# Patient Record
Sex: Female | Born: 1975
Health system: Southern US, Community
[De-identification: ages and names within clinical notes are randomized; demographics above are authoritative.]

## PROBLEM LIST (undated history)

## (undated) DIAGNOSIS — L400 Psoriasis vulgaris: Secondary | ICD-10-CM

## (undated) HISTORY — DX: Psoriasis vulgaris: L40.0

## (undated) HISTORY — PX: LAPAROSCOPIC ABDOMINAL EXPLORATION: SHX6249

## (undated) HISTORY — PX: TUBAL LIGATION: SHX77

---

## 2001-04-28 ENCOUNTER — Other Ambulatory Visit: Admission: RE | Admit: 2001-04-28 | Discharge: 2001-04-28 | Payer: Self-pay | Admitting: *Deleted

## 2003-07-11 ENCOUNTER — Inpatient Hospital Stay (HOSPITAL_COMMUNITY): Admission: AD | Admit: 2003-07-11 | Discharge: 2003-07-13 | Payer: Self-pay | Admitting: Obstetrics and Gynecology

## 2004-01-29 ENCOUNTER — Other Ambulatory Visit: Admission: RE | Admit: 2004-01-29 | Discharge: 2004-01-29 | Payer: Self-pay | Admitting: Obstetrics and Gynecology

## 2004-05-13 ENCOUNTER — Ambulatory Visit (HOSPITAL_COMMUNITY): Admission: RE | Admit: 2004-05-13 | Discharge: 2004-05-13 | Payer: Self-pay | Admitting: Obstetrics and Gynecology

## 2005-04-04 ENCOUNTER — Emergency Department (HOSPITAL_COMMUNITY): Admission: EM | Admit: 2005-04-04 | Discharge: 2005-04-04 | Payer: Self-pay | Admitting: Emergency Medicine

## 2005-05-19 ENCOUNTER — Ambulatory Visit (HOSPITAL_COMMUNITY): Admission: RE | Admit: 2005-05-19 | Discharge: 2005-05-19 | Payer: Self-pay | Admitting: Obstetrics and Gynecology

## 2008-02-25 ENCOUNTER — Ambulatory Visit (HOSPITAL_COMMUNITY): Admission: RE | Admit: 2008-02-25 | Discharge: 2008-02-25 | Payer: Self-pay | Admitting: Family Medicine

## 2008-03-07 ENCOUNTER — Other Ambulatory Visit: Admission: RE | Admit: 2008-03-07 | Discharge: 2008-03-07 | Payer: Self-pay | Admitting: Obstetrics and Gynecology

## 2009-03-20 ENCOUNTER — Other Ambulatory Visit: Admission: RE | Admit: 2009-03-20 | Discharge: 2009-03-20 | Payer: Self-pay | Admitting: Obstetrics and Gynecology

## 2011-04-04 NOTE — H&P (Signed)
   NAME:  [name], [name] NO.:  [id number]   MEDICAL RECORD NO.:  [id number]                  PATIENT TYPE:   LOCATION:                                       FACILITY:   PHYSICIAN:  Tilda Burrow, M.D.              DATE OF BIRTH:  [birth year]   DATE OF ADMISSION:  07/11/2003  DATE OF DISCHARGE:                                HISTORY & PHYSICAL   REASON FOR ADMISSION:  Pregnancy at 39 weeks and 1 day for elective  induction of labor due to maternal fatigue and swelling and insomnia.   HISTORY OF PRESENT ILLNESS:  Breanna Mcguire is a gravida 2, para 1 with an EDC of  August 30 who is in today with complaints of discomfort, swelling and  inability to sleep and desires elective induction. After discussing the  risks and benefits of induction, the patient still desires to proceed with  induction.   PAST MEDICAL HISTORY:  Negative.   PAST SURGICAL HISTORY:  Negative.   ALLERGIES:  She is allergic to SULFA, it causes a rash.   MEDICATIONS:  She is on prenatal vitamins and Tylenol p.r.n.   SOCIAL HISTORY:  She is married.   PRENATAL COURSE:  Essentially uneventful.   LABORATORY DATA:  Blood type is A positive, GBS is negative, rubella is  immune, hepatitis B surface antigen negative, HIV is negative, serology is  nonreactive, Pap is class 1. GC and Chlamydia are both negative. AFP is  normal, GBS is negative, 28 week hemoglobin 10.8, 28 week hematocrit 32.9,  one hour glucose tolerance was 112.   PHYSICAL EXAMINATION:  VITAL SIGNS:  Weight is 210, that is 6 pounds up from  last visit due to swelling. Blood pressure 128/60.  PELVIC:  She has 2+ pitting edema, there is good fetal movement. Fetal heart  rate is 130, strong and regular. Fundal height is 38 cm in vertex  presentation. GBS is negative. Cervix is 1-2, thick, soft, and posterior,  presenting part is -2.   PLAN:  We are going to admit for Foley bulb induction of labor.     Zerita Boers,  Reita Cliche, M.D.    DL/MEDQ  D:  91/47/8295  T:  07/11/2003  Job:  [id number]

## 2011-04-04 NOTE — Op Note (Signed)
NAMEALEKSA, Breanna Mcguire NO.:  [id number]   MEDICAL RECORD NO.:  [id number]          PATIENT TYPE:  AMB   LOCATION:  DAY                           FACILITY:  [facility]   PHYSICIAN:  Tilda Burrow, M.D. DATE OF BIRTH:  1976-04-19   DATE OF PROCEDURE:  05/19/2005  DATE OF DISCHARGE:                                 OPERATIVE REPORT   PREOPERATIVE DIAGNOSIS:  Elective sterilization.   POSTOPERATIVE DIAGNOSIS:  Elective sterilization.  Endometriosis grade 1.   OPERATION PERFORMED:  Laparoscopic tubal sterilization, Fallope rings.   SURGEON:  Tilda Burrow, M.D.   ASSISTANT:  None.   ANESTHESIA:  General.   COMPLICATIONS:  None.   FINDINGS:  Normal tubes and ovaries bilaterally, grade 1 endometrial  implants in the cul-de-sac.   INDICATIONS FOR PROCEDURE:  35 year old female requesting elective permanent  sterilization.   DESCRIPTION OF PROCEDURE:  The patient was taken to the operating room,  prepped and draped for combined abdominal and vaginal procedure with Hulka  tenaculum attached to the cervix and legs supported in yellow fin leg  supports. Infraumbilical vertical 1 cm skin incisions made as well as  transverse suprapubic incision.  The Veress needle was introduced through  the umbilicus and water droplet test indicated intraperitoneal location.  We  initially started insufflating and the pressure was 11 mm, so needle was  extracted and repositioned. Water  droplet test repeated and this time it  was under 8 mm mmHg pressure.  The pneumoperitoneum was achieved to 1.5  liters at which time laparoscopic trocar was introduced.  We could visualize  that we were intra-abdominal though there was a small amount of  intraperitoneal gas and some preperitoneal insufflation had occurred which  was not considered clinically significant.  We then insufflated the abdomen  entirely, inspected the bowel, saw no evidence of trauma, and then placed a  suprapubic 8 mm  trocar under direct visualization.  Attention was first  directed to the left tube and ovary.  The bladder was quite full and we in  and out catheterized the bladder to remove the physical obstruction of the  full bladder.  400 mL were obtained. Adnexa were then easily visualized and  there were no adhesions on the tube, ovary or anterior bladder flap. The cul-  de-sac showed two small endometrial implants of dark powder burn type  endometriosis in the pelvic floor.  There were photos taken to document  this.  We then proceeded with identification of each fallopian tube, we  elevated the left tube, placed two rings on the tube on this side and then  infiltrated the knuckle of tube and the underlying broad ligament with  Marcaine solution using a percutaneous needle.  The opposite tube was  treated similarly and a single ring placed on its midportion.  Inspection of  the pelvis again confirmed the endometriosis but no retractions, adhesions  or endometriomas. The patient then had instilling  saline solution into the abdomen followed by deflation of the abdomen,  closure of each skin incision with subcuticular 4-0 Dexon.  With sponge and  needle counts  correct, the patient tolerated the procedure well and went to  recovery room in good condition.       JVF/MEDQ  D:  05/19/2005  T:  05/19/2005  Job:  [id number]

## 2011-04-04 NOTE — H&P (Signed)
Breanna Mcguire, Breanna Mcguire             ACCOUNT NO.:  [id number]   MEDICAL RECORD NO.:  [id number]          PATIENT TYPE:  AMB   LOCATION:  DAY                           FACILITY:  [facility]   PHYSICIAN:  Tilda Burrow, M.D. DATE OF BIRTH:  07-12-1976   DATE OF ADMISSION:  05/19/2004  DATE OF DISCHARGE:  LH                                HISTORY & PHYSICAL   ADMITTING DIAGNOSES:  1.  Desire for elective permanent sterilization.  2.  Abdominal wall laxity.  3.  Right lower quadrant pain with menses extending down the right leg.   HISTORY OF PRESENT ILLNESS:  This 35 year old female, gravida 2, para 2,  several years status post second vaginal delivery, is admitted after  requesting elective permanent sterilization.  Contraceptive counseling has  been performed with technical aspects of the procedure reviewed using  Krame's instructional booklet with visual guides reviewed with the patient,  as per our protocol.  The patient declines any further consideration of  other surgeries.  More recently, she has diagnostic laparoscopy for chronic  pelvic pain on the right side, predominantly running down the right leg with  menses, which she has noted since her second pregnancy and the IUD that was  placed shortly after it.  This continued after the IUD was removed, and  laparoscopy was negative.  She has no history of back pain at this time.  The technical aspects of the procedure have been reviewed.  A failure of 1%  is quoted in addressing failure.   MEDICAL HISTORY:  Benign.   SURGICAL HISTORY:  Negative.   GYNECOLOGICAL HISTORY:  Abnormal Pap, treated with cryosurgery in 2005.  Two  consecutive negative Paps since then.   OBSTETRICAL HISTORY:  Vaginal delivery x2 with a 60 pound weight loss since  peak pregnancy weight.   PHYSICAL EXAMINATION:  GENERAL:  Healthy, somber, Caucasian female in  moderate discomfort, with pelvic exams otherwise normal.  HEENT:  Normal.  CHEST:  Clear to  auscultation.  ABDOMEN:  Nontender.  Lax skin.  No hernias or masses.  PELVIC:  External genitalia normal.  Vaginal exam with normal secretions.  Cervix multiparous.  Uterus anteflexed.  Adnexa nontender.   PLAN:  Elective sterilization with fallopian ring on May 19, 2005.       JVF/MEDQ  D:  05/14/2005  T:  05/14/2005  Job:  [id number]   cc:   [facility]   [facility]

## 2011-04-04 NOTE — Op Note (Signed)
NAME:  Breanna Mcguire, Breanna Mcguire.                      ACCOUNT NO.:  [id number]   MEDICAL RECORD NO.:  [id number]                  PATIENT TYPE:  AMB   LOCATION:                                       FACILITY:   PHYSICIAN:  Tilda Burrow, M.D.              DATE OF BIRTH:   DATE OF PROCEDURE:  05/13/2004  DATE OF DISCHARGE:                                 OPERATIVE REPORT   PREOPERATIVE DIAGNOSIS:  Chronic pelvic pain, rule out endometriosis.   POSTOPERATIVE DIAGNOSIS:  Chronic pelvic pain.   PROCEDURE:  Diagnostic laparoscopy.   SURGEON:  Tilda Burrow, M.D.   ASSISTANTAlinda Money, RN   ANESTHESIA:  General.   COMPLICATIONS:  None.   FINDINGS:  Completely normal appearing anatomy with generalized pelvic  laxity.  Large cul-de-sac.  Absolutely no evidence of endometriosis.   DETAILS OF PROCEDURE:  The patient was taken to the operating room and  prepped and draped in the usual fashion for combined abdominal and vaginal  procedure with Hulka tenaculum attached to the cervix for uterine  manipulation; and Foley catheter in place.   An infraumbilical vertical, 1-cm skin incision was made as well as a  transverse suprapubic incision.  The abdomen was palpated.  The sacral  promontory palpable 3 cm below the umbilicus. We were very careful to  elevate the abdominal wall while orienting the Veress needle towards the  pelvis; and pneumoperitoneum was achieved after water-droplet test confirmed  intraperitoneal location.  Abdominal insufflation was under 8 mmHg pressure.  Laparoscopic was placed under direct visualization, and suprapubic 5-mm  trocar placed without difficulty.   Inspection of the pelvis revealed normal anatomy with photos taken of the  bladder flap, cul-de-sac, both adnexa and taken to the office for discussion  with the patient.  There was absolutely no suggestion of endometriosis or  scarring.  Inspection of the upper abdomen showed no adhesions other than a  small piece of well-healed, quiet, flexible adhesions in the right lower  quadrant from the omentum to the anterior sidewall perhaps representing an  old peritonitis.  The appendix could be visualized and was nontender and  mobile.   Deflation of the abdomen was followed by subcutaneous closure of the  umbilical incision and subcutaneous and subcuticular closure of both skin  incisions.  The patient tolerated the procedure well; went to recovery room  in good condition.  Afterwards we discussed the findings with the patient's  husband and she had photographs.  The patient  will continue her birth control pills.  At the present time she is undecided  about future childbearing.  It is likely that once she has had all her  children that, if discomfort persists 6 months postpartum after her final  child she may consider vaginal hysterectomy for sterilization rather than  lesser procedure.      ___________________________________________  Tilda Burrow, M.D.   JVF/MEDQ  D:  05/13/2004  T:  05/13/2004  Job:  [id number]   cc:   Tilda Burrow, M.D.  [address] [place]  [place] [address]  Fax: [phone]

## 2011-04-04 NOTE — Op Note (Signed)
   NAME:  [name], [name] NO.:  [id number]   MEDICAL RECORD NO.:  [id number]                   PATIENT TYPE:  INP   LOCATION:  LDR1                                 FACILITY:  [facility]   PHYSICIAN:  Lazaro Arms, M.D.                DATE OF BIRTH:  [birth year]   DATE OF PROCEDURE:  07/12/2003  DATE OF DISCHARGE:                                 OPERATIVE REPORT   EPIDURAL NOTE   Breanna Mcguire is a 35 year old gravida 3, para 1, 4 cm dilated asking for an  epidural.   She is placed in the sitting position.  The iliac crests are identified.  The midline spinous processes are identified.  The L2-L3 interspace is  marked.  The area is prepped and draped.  Lidocaine 1% is injected as a  local anesthetic. A 17-gauge Touhy needle was used and a loss of resistance  technique is employed.  With 1 pass the epidural space is located without  difficulty.  Ten cc of 0.125% bupivacaine is given.  The catheter is then  threaded 5 cm into the epidural space.  An additional 10 cc of 0.125%  bupivacaine in 5 cc of 1.5% lidocaine with 1: 200,000 epinephrine is given  to dose up the epidural.  She is then hooked up to continuous pump of  0.125% bupivacaine with 2 mcg/cc of Fentanyl at 12 cc an hour.   The patient is comfortable, blood pressure is stable.  Fetal heart rate  tracing is reassuring.                                               Lazaro Arms, M.D.    Breanna Mcguire  D:  07/12/2003  T:  07/12/2003  Job:  [id number]

## 2011-04-04 NOTE — H&P (Signed)
NAME:  Breanna Mcguire, Breanna Mcguire NO.:  [id number]   MEDICAL RECORD NO.:  [id number]                   PATIENT TYPE:  AMB   LOCATION:  DAY                                  FACILITY:  [facility]   PHYSICIAN:  Tilda Burrow, M.D.              DATE OF BIRTH:  03-09-1976   DATE OF ADMISSION:  05/13/2004  DATE OF DISCHARGE:                                HISTORY & PHYSICAL   ADMISSION DIAGNOSIS:  Chronic pelvic pain, rule out endometriosis.   HISTORY OF PRESENT ILLNESS:  This 35 year old Mcguire is seen in followup for  chronic pelvic pain.  She complains of a continuous pain experience lasting  throughout the cycle noted especially with intimacy and before menses.  She  was evaluated by Dr. Despina Mcguire over the past few weeks and was described as  having a grade 1-2 prolapse, normal ovaries and negative cervical motion  tenderness.  She has been tried empirically on antibiotic therapy and has  had no improvement in her symptoms.  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  Endocervical curettage  was negative.  Breanna Mcguire is reluctant to make any decisions regarding the  surgical considerations such as hysterectomy until she has more information  about the presence or absence of endometriosis.  She has requested that we  move promptly to laparoscopy.  Plans for day surgery include laparoscopic  evaluation of the pelvis with laser if necessary for endometriosis.  If  findings are negative, then we will perform uterosacral nerve ablation and  simply follow the patient.   PAST MEDICAL HISTORY:  Benign.   PAST SURGICAL HISTORY:  Negative.   PAST GYNECOLOGICAL HISTORY:  Abnormal Pap smear treated with cryosurgery  earlier this year.   PAST SURGICAL HISTORY:  Negative.   MEDICATIONS:  Ortho-Evra oral contraceptives at present.  Prior use of IUD  removed in  January 2005.   PAST OBSTETRICAL HISTORY:  Spontaneous vaginal delivery in August 2004.   PHYSICAL EXAMINATION:  VITAL SIGNS:  Height 5 feet 5 inches, weight 164.  Blood pressure 120/72.  GENERAL:  This is a healthy-appearing, Breanna Mcguire, somber in moderate  discomfort with exam only.  HEENT:  Within normal limits.  CARDIAC:  Unremarkable.  ABDOMEN:  Nontender without masses.  PELVIC:  External genitalia normal.  Normal vaginal exam.  Multiparous.  First-degree uterine descensus.  Mobile cervix.  Uterus anteflexed.  Adnexa  negative for masses.  Dull pelvic tenderness.   ASSESSMENT:  1. Rule out endometriosis, chronic pelvic pain.  2. History of cervical dysplasia.   PLAN:  Diagnostic laparoscopy.     ___________________________________________  Tilda Burrow, M.D.   JVF/MEDQ  D:  05/13/2004  T:  05/13/2004  Job:  [id number]

## 2011-04-04 NOTE — Op Note (Signed)
   NAME:  DEONNA, KRUMMEL NO.:  [id number]   MEDICAL RECORD NO.:  [id number]                   PATIENT TYPE:  INP   LOCATION:  LDR1                                 FACILITY:  [facility]   PHYSICIAN:  Lazaro Arms, M.D.                DATE OF BIRTH:  01-04-1976   DATE OF PROCEDURE:  07/12/2003  DATE OF DISCHARGE:                                 OPERATIVE REPORT   ONSET OF LABOR:  July 12, 2003 at 6 a.m.   LENGTH OF FIRST STAGE LABOR:  6 hours and 20 minutes.   LENGTH OF SECOND STAGE LABOR:  16 minutes.   LENGTH OF THIRD STAGE LABOR:  6 minutes.   DESCRIPTION OF PROCEDURE:  Dorotea had a normal spontaneous vaginal  delivery under epidural anesthesia of a viable female infant. Apgar were 9&9.  Upon delivery of head, shoulders rotated easily and infant spontaneously  delivered without difficulty. Nose and mouth were thoroughly suctioned,  infant dried, cord clamped and cut. Infant placed on the mother's abdomen in  active condition with a good strong cry moving all extremities and pinking  up well. Upon inspection, the perineum was noted to be intact. Placenta was  delivered spontaneously via Schultz's mechanism, a three-vessel cord was  noted upon inspection. Due to a suspicious looking ancillary lobe versus  scarring from an early bleed, placenta was sent to pathology for evaluation.  Third stage of labor was actively managed with 20 units of Pitocin and 1000  mL of lactated Ringer's. Estimated blood loss was 350 mL. Infant and mother  stabilized and epidural catheter was removed from the patient's back and  blue tip was intact. Both were stable and transferred out to the postpartum  unit in good condition.     Zerita Boers, N.M.                      Lazaro Arms, M.D.    DL/MEDQ  D:  16/08/9603  T:  07/12/2003  Job:  [id number]

## 2015-06-27 ENCOUNTER — Ambulatory Visit (INDEPENDENT_AMBULATORY_CARE_PROVIDER_SITE_OTHER): Payer: BLUE CROSS/BLUE SHIELD | Admitting: Family Medicine

## 2015-06-27 ENCOUNTER — Encounter: Payer: Self-pay | Admitting: Family Medicine

## 2015-06-27 VITALS — BP 118/68 | Temp 98.5°F | Ht 65.0 in | Wt 190.2 lb

## 2015-06-27 DIAGNOSIS — H00036 Abscess of eyelid left eye, unspecified eyelid: Secondary | ICD-10-CM

## 2015-06-27 MED ORDER — DOXYCYCLINE HYCLATE 100 MG PO CAPS: 100.0000 mg | ORAL_CAPSULE | Freq: Two times a day (BID) | ORAL | Status: DC

## 2015-06-27 NOTE — Progress Notes (Signed)
   Subjective:    Patient ID: Breanna Mcguire, female    DOB: 1976-04-02, 39 y.o.   MRN: [id number]  HPI Patient arrives with c/o left eye lid swelling. Denies fevers chills sweats she relates soreness in the upper eyelid she denies blurred vision denies sinus pain or discomfort PMH benign  Review of Systems     Objective:   Physical Exam She has an infected gland along the upper eyelid no abscess noted with it. There is some redness and swelling in the upper eyelid the rest of the eye appears normal       Assessment & Plan:  Localized infection could be staph infection recommend antibiotics warm compresses if not significantly better over the course of the next 5 days to let us now

## 2015-07-30 ENCOUNTER — Ambulatory Visit (INDEPENDENT_AMBULATORY_CARE_PROVIDER_SITE_OTHER): Payer: BLUE CROSS/BLUE SHIELD | Admitting: Obstetrics and Gynecology

## 2015-07-30 ENCOUNTER — Encounter: Payer: Self-pay | Admitting: Obstetrics and Gynecology

## 2015-07-30 ENCOUNTER — Other Ambulatory Visit (HOSPITAL_COMMUNITY)
Admission: RE | Admit: 2015-07-30 | Discharge: 2015-07-30 | Disposition: A | Discharge status: Home / Self Care | Payer: BLUE CROSS/BLUE SHIELD | Source: Ambulatory Visit | Attending: Obstetrics and Gynecology | Admitting: Obstetrics and Gynecology

## 2015-07-30 VITALS — BP 110/60 | Ht 65.0 in | Wt 188.0 lb

## 2015-07-30 DIAGNOSIS — Z1151 Encounter for screening for human papillomavirus (HPV): Secondary | ICD-10-CM | POA: Insufficient documentation

## 2015-07-30 DIAGNOSIS — Z01419 Encounter for gynecological examination (general) (routine) without abnormal findings: Secondary | ICD-10-CM | POA: Diagnosis not present

## 2015-07-30 NOTE — Progress Notes (Signed)
Patient ID: Breanna Mcguire, female   DOB: 06-15-1976, 39 y.o.   MRN: [id number]  Assessment:  Annual Gyn Exam   Plan:  1. pap smear done, next pap due in 3 years 2. return annually or prn 3    Annual mammogram advised Subjective:  Breanna Mcguire is a 39 y.o. female No obstetric history on file. who presents for annual exam. Patient's last menstrual period was 07/08/2015. The patient has no complaints today. Pt reports menses that last 2-3 days. Her last GYN exam was 6 years ago. Pt denies bladder or bowel concerns.  The following portions of the patient's history were reviewed and updated as appropriate: allergies, current medications, past family history, past medical history, past social history, past surgical history and problem list. History reviewed. No pertinent past medical history.  Past Surgical History  Procedure Laterality Date  . Laparoscopic abdominal exploration    . Tubal ligation      No current outpatient prescriptions on file.  Review of Systems Constitutional: negative Gastrointestinal: negative Genitourinary: negative  Objective:  BP 110/60 mmHg  Ht  (1.651 m)  Wt 188 lb (85.276 kg)  BMI 31.28 kg/m2  LMP 07/08/2015   BMI: Body mass index is 31.28 kg/(m^2).  General Appearance: Alert, appropriate appearance for age. No acute distress HEENT: Grossly normal Neck / Thyroid:  Cardiovascular: RRR; normal S1, S2, no murmur Lungs: CTA bilaterally Back: No CVAT Breast Exam: Normal to inspection, Normal breast tissue bilaterally and No masses or nodes.No dimpling, nipple retraction or discharge. Gastrointestinal: Soft, non-tender, no masses or organomegaly Pelvic Exam: Vulva and vagina appear normal. Bimanual exam reveals normal uterus and adnexa. External genitalia: normal general appearance Vaginal: normal mucosa without prolapse or lesions and normal without tenderness, induration or masses Cervix: normal appearance and well-supported Adnexa:  normal bimanual exam Uterus: normal single, nontender and tiny; anterior Rectovaginal: normal rectal, no masses and guaiac negative stool obtained Lymphatic Exam: Non-palpable nodes in neck, clavicular, axillary, or inguinal regions  Skin: no rash or abnormalities Neurologic: Normal gait and speech, no tremor  Psychiatric: Alert and oriented, appropriate affect.  Urinalysis:Not done Hemoccult: Negative  Christin Bach. MD Pgr [phone] 10:39 AM   This chart was scribed for Tilda Burrow, MD by Gwenyth Ober, Medical Scribe. This patient was seen in room 2 and the patient's care was started at 11:22 AM.   I personally performed the services described in this documentation, which was SCRIBED in my presence. The recorded information has been reviewed and considered accurate. It has been edited as necessary during review. Tilda Burrow, MD

## 2015-07-30 NOTE — Progress Notes (Signed)
Patient ID: Breanna Mcguire, female   DOB: [birth year], 39 y.o.   MRN: [id number] Pt here today for annual exam.

## 2015-07-31 LAB — CYTOLOGY - PAP

## 2015-12-10 ENCOUNTER — Ambulatory Visit (INDEPENDENT_AMBULATORY_CARE_PROVIDER_SITE_OTHER): Payer: BLUE CROSS/BLUE SHIELD | Admitting: Nurse Practitioner

## 2015-12-10 ENCOUNTER — Encounter: Payer: Self-pay | Admitting: Nurse Practitioner

## 2015-12-10 VITALS — BP 110/72 | HR 72 | Ht 65.0 in | Wt 191.2 lb

## 2015-12-10 DIAGNOSIS — K219 Gastro-esophageal reflux disease without esophagitis: Secondary | ICD-10-CM | POA: Diagnosis not present

## 2015-12-10 DIAGNOSIS — K582 Mixed irritable bowel syndrome: Secondary | ICD-10-CM | POA: Diagnosis not present

## 2015-12-10 MED ORDER — PANTOPRAZOLE SODIUM 40 MG PO TBEC: 40.0000 mg | DELAYED_RELEASE_TABLET | Freq: Every day | ORAL | Status: DC

## 2015-12-10 MED ORDER — HYOSCYAMINE SULFATE 0.125 MG SL SUBL: 0.1250 mg | SUBLINGUAL_TABLET | SUBLINGUAL | Status: DC | PRN

## 2015-12-10 NOTE — Progress Notes (Signed)
Subjective:  Presents for c/o alternating cycles of diarrhea and constipation over the past year. No fevers. No blood in stool. Normal color. Lower abdominal cramping only with BM then resolves. Unassociated with meals or particular foods. Has had several BMs in the past 12 hours. Taking Nexium OTC occasionally for reflux. Her mother has GERD and IBS. Smoker. Drinks a large amount of caffeine.   Objective:   BP 110/72 mmHg  Pulse 72  Ht  (1.651 m)  Wt 191 lb 3.2 oz (86.728 kg)  BMI 31.82 kg/m2 NAD. Alert, oriented. Lungs clear. Heart RRR. Abdomen soft, mildly obese, non distended, with active BS x 4. Mild epigastric and generalized lower abdominal tenderness, more towards LLQ. No obvious masses; no rebound or guarding.   Assessment:  Problem List Items Addressed This Visit      Digestive   Gastroesophageal reflux disease without esophagitis - Primary   Relevant Medications   hyoscyamine (LEVSIN/SL) 0.125 MG SL tablet   pantoprazole (PROTONIX) 40 MG tablet   Irritable bowel syndrome with both constipation and diarrhea   Relevant Medications   hyoscyamine (LEVSIN/SL) 0.125 MG SL tablet   pantoprazole (PROTONIX) 40 MG tablet     Plan:  Meds ordered this encounter  Medications  . hyoscyamine (LEVSIN/SL) 0.125 MG SL tablet    Sig: Place 1 tablet (0.125 mg total) under the tongue every 4 (four) hours as needed.    Dispense:  30 tablet    Refill:  0    Order Specific Question:  Supervising Provider    Answer:  Merlyn Albert [[id number]]  . pantoprazole (PROTONIX) 40 MG tablet    Sig: Take 1 tablet (40 mg total) by mouth daily.    Dispense:  30 tablet    Refill:  2    Order Specific Question:  Supervising Provider    Answer:  Merlyn Albert [[id number]]   Switch to Pantoprazole to see if this will save money. Reduce tobacco and caffeine use. Given information on IBS and GERD. Add probiotics and fiber to diet. Call back if worsens or persists.

## 2015-12-10 NOTE — Patient Instructions (Addendum)
Align as directed OR activia yogurt benefiber  Gastroesophageal Reflux Disease, Adult Normally, food travels down the esophagus and stays in the stomach to be digested. However, when a person has gastroesophageal reflux disease (GERD), food and stomach acid move back up into the esophagus. When this happens, the esophagus becomes sore and inflamed. Over time, GERD can create small holes (ulcers) in the lining of the esophagus.  CAUSES This condition is caused by a problem with the muscle between the esophagus and the stomach (lower esophageal sphincter, or LES). Normally, the LES muscle closes after food passes through the esophagus to the stomach. When the LES is weakened or abnormal, it does not close properly, and that allows food and stomach acid to go back up into the esophagus. The LES can be weakened by certain dietary substances, medicines, and medical conditions, including:  Tobacco use.  Pregnancy.  Having a hiatal hernia.  Heavy alcohol use.  Certain foods and beverages, such as coffee, chocolate, onions, and peppermint. RISK FACTORS This condition is more likely to develop in:  People who have an increased body weight.  People who have connective tissue disorders.  People who use NSAID medicines. SYMPTOMS Symptoms of this condition include:  Heartburn.  Difficult or painful swallowing.  The feeling of having a lump in the throat.  Abitter taste in the mouth.  Bad breath.  Having a large amount of saliva.  Having an upset or bloated stomach.  Belching.  Chest pain.  Shortness of breath or wheezing.  Ongoing (chronic) cough or a night-time cough.  Wearing away of tooth enamel.  Weight loss. Different conditions can cause chest pain. Make sure to see your health care provider if you experience chest pain. DIAGNOSIS Your health care provider will take a medical history and perform a physical exam. To determine if you have mild or severe GERD, your  health care provider may also monitor how you respond to treatment. You may also have other tests, including:  An endoscopy toexamine your stomach and esophagus with a small camera.  A test thatmeasures the acidity level in your esophagus.  A test thatmeasures how much pressure is on your esophagus.  A barium swallow or modified barium swallow to show the shape, size, and functioning of your esophagus. TREATMENT The goal of treatment is to help relieve your symptoms and to prevent complications. Treatment for this condition may vary depending on how severe your symptoms are. Your health care provider may recommend:  Changes to your diet.  Medicine.  Surgery. HOME CARE INSTRUCTIONS Diet  Follow a diet as recommended by your health care provider. This may involve avoiding foods and drinks such as:  Coffee and tea (with or without caffeine).  Drinks that containalcohol.  Energy drinks and sports drinks.  Carbonated drinks or sodas.  Chocolate and cocoa.  Peppermint and mint flavorings.  Garlic and onions.  Horseradish.  Spicy and acidic foods, including peppers, chili powder, curry powder, vinegar, hot sauces, and barbecue sauce.  Citrus fruit juices and citrus fruits, such as oranges, lemons, and limes.  Tomato-based foods, such as red sauce, chili, salsa, and pizza with red sauce.  Fried and fatty foods, such as donuts, french fries, potato chips, and high-fat dressings.  High-fat meats, such as hot dogs and fatty cuts of red and white meats, such as rib eye steak, sausage, ham, and bacon.  High-fat dairy items, such as whole milk, butter, and cream cheese.  Eat small, frequent meals instead of large meals.  Avoid drinking large amounts of liquid with your meals.  Avoid eating meals during the 2-3 hours before bedtime.  Avoid lying down right after you eat.  Do not exercise right after you eat. General Instructions  Pay attention to any changes in  your symptoms.  Take over-the-counter and prescription medicines only as told by your health care provider. Do not take aspirin, ibuprofen, or other NSAIDs unless your health care provider told you to do so.  Do not use any tobacco products, including cigarettes, chewing tobacco, and e-cigarettes. If you need help quitting, ask your health care provider.  Wear loose-fitting clothing. Do not wear anything tight around your waist that causes pressure on your abdomen.  Raise (elevate) the head of your bed 6 inches (15cm).  Try to reduce your stress, such as with yoga or meditation. If you need help reducing stress, ask your health care provider.  If you are overweight, reduce your weight to an amount that is healthy for you. Ask your health care provider for guidance about a safe weight loss goal.  Keep all follow-up visits as told by your health care provider. This is important. SEEK MEDICAL CARE IF:  You have new symptoms.  You have unexplained weight loss.  You have difficulty swallowing, or it hurts to swallow.  You have wheezing or a persistent cough.  Your symptoms do not improve with treatment.  You have a hoarse voice. SEEK IMMEDIATE MEDICAL CARE IF:  You have pain in your arms, neck, jaw, teeth, or back.  You feel sweaty, dizzy, or light-headed.  You have chest pain or shortness of breath.  You vomit and your vomit looks like blood or coffee grounds.  You faint.  Your stool is bloody or black.  You cannot swallow, drink, or eat.   This information is not intended to replace advice given to you by your health care provider. Make sure you discuss any questions you have with your health care provider.   Document Released: 08/13/2005 Document Revised: 07/25/2015 Document Reviewed: 02/28/2015 Elsevier Interactive Patient Education 2016 [employer]     Irritable Bowel Syndrome, Adult Irritable bowel syndrome (IBS) is not one specific disease. It is a group of  symptoms that affects the organs responsible for digestion (gastrointestinal or GI tract).  To regulate how your GI tract works, your body sends signals back and forth between your intestines and your brain. If you have IBS, there may be a problem with these signals. As a result, your GI tract does not function normally. Your intestines may become more sensitive and overreact to certain things. This is especially true when you eat certain foods or when you are under stress.  There are four types of IBS. These may be determined based on the consistency of your stool:   IBS with diarrhea.   IBS with constipation.   Mixed IBS.   Unsubtyped IBS.  It is important to know which type of IBS you have. Some treatments are more likely to be helpful for certain types of IBS.  CAUSES  The exact cause of IBS is not known. RISK FACTORS You may have a higher risk of IBS if:  You are a woman.  You are younger than 41 years old.  You have a family history of IBS.  You have mental health problems.  You have had bacterial infection of your GI tract. SIGNS AND SYMPTOMS  Symptoms of IBS vary from person to person. The main symptom is abdominal pain or discomfort.  Additional symptoms usually include one or more of the following:   Diarrhea, constipation, or both.   Abdominal swelling or bloating.   Feeling full or sick after eating a small or regular-size meal.   Frequent gas.   Mucus in the stool.   A feeling of having more stool left after a bowel movement.  Symptoms tend to come and go. They may be associated with stress, psychiatric conditions, or nothing at all.  DIAGNOSIS  There is no specific test to diagnose IBS. Your health care provider will make a diagnosis based on a physical exam, medical history, and your symptoms. You may have other tests to rule out other conditions that may be causing your symptoms. These may include:   Blood tests.   X-rays.   CT  scan.  Endoscopy and colonoscopy. This is a test in which your GI tract is viewed with a long, thin, flexible tube. TREATMENT There is no cure for IBS, but treatment can help relieve symptoms. IBS treatment often includes:   Changes to your diet, such as:  Eating more fiber.  Avoiding foods that cause symptoms.  Drinking more water.  Eating regular, medium-sized portioned meals.  Medicines. These may include:  Fiber supplements if you have constipation.  Medicine to control diarrhea (antidiarrheal medicines).  Medicine to help control muscle spasms in your GI tract (antispasmodic medicines).  Medicines to help with any mental health issues, such as antidepressants or tranquilizers.  Therapy.  Talk therapy may help with anxiety, depression, or other mental health issues that can make IBS symptoms worse.  Stress reduction.  Managing your stress can help keep symptoms under control. HOME CARE INSTRUCTIONS   Take medicines only as directed by your health care provider.  Eat a healthy diet.  Avoid foods and drinks with added sugar.  Include more whole grains, fruits, and vegetables gradually into your diet. This may be especially helpful if you have IBS with constipation.  Avoid any foods and drinks that make your symptoms worse. These may include dairy products and caffeinated or carbonated drinks.  Do not eat large meals.  Drink enough fluid to keep your urine clear or pale yellow.  Exercise regularly. Ask your health care provider for recommendations of good activities for you.  Keep all follow-up visits as directed by your health care provider. This is important. SEEK MEDICAL CARE IF:   You have constant pain.  You have trouble or pain with swallowing.  You have worsening diarrhea. SEEK IMMEDIATE MEDICAL CARE IF:   You have severe and worsening abdominal pain.   You have diarrhea and:   You have a rash, stiff neck, or severe headache.   You are  irritable, sleepy, or difficult to awaken.   You are weak, dizzy, or extremely thirsty.   You have bright red blood in your stool or you have black tarry stools.   You have unusual abdominal swelling that is painful.   You vomit continuously.   You vomit blood (hematemesis).   You have both abdominal pain and a fever.    This information is not intended to replace advice given to you by your health care provider. Make sure you discuss any questions you have with your health care provider.   Document Released: 11/03/2005 Document Revised: 11/24/2014 Document Reviewed: 07/21/2014 Elsevier Interactive Patient Education [employer]

## 2016-01-04 ENCOUNTER — Ambulatory Visit (INDEPENDENT_AMBULATORY_CARE_PROVIDER_SITE_OTHER): Payer: BLUE CROSS/BLUE SHIELD | Admitting: Family Medicine

## 2016-01-04 ENCOUNTER — Encounter: Payer: Self-pay | Admitting: Family Medicine

## 2016-01-04 VITALS — BP 124/70 | Temp 98.2°F | Ht 65.0 in | Wt 190.4 lb

## 2016-01-04 DIAGNOSIS — J329 Chronic sinusitis, unspecified: Secondary | ICD-10-CM | POA: Diagnosis not present

## 2016-01-04 MED ORDER — BENZONATATE 100 MG PO CAPS: 100.0000 mg | ORAL_CAPSULE | Freq: Four times a day (QID) | ORAL | Status: DC | PRN

## 2016-01-04 MED ORDER — AMOXICILLIN 500 MG PO CAPS: 500.0000 mg | ORAL_CAPSULE | Freq: Three times a day (TID) | ORAL | Status: DC

## 2016-01-04 NOTE — Progress Notes (Signed)
   Subjective:    Patient ID: Janene Harvey, female    DOB: 1976/10/18, 40 y.o.   MRN: [id number]  URI  This is a new problem. The current episode started in the past 7 days. The problem has been unchanged. There has been no fever. Associated symptoms include coughing, headaches and a sore throat. Associated symptoms comments: Runny nose. She has tried nothing for the symptoms. The treatment provided no relief.  frontal headache but also diffuse in nature.  Others in family with confirm flu.  Symptoms have been going on for several days.  achey back and shoulders and dim energ      Review of Systems  HENT: Positive for sore throat.   Respiratory: Positive for cough.   Neurological: Positive for headaches.       Objective:   Physical Exam Alert, mild malaise. Hydration good Vitals stable. frontal/ maxillary tenderness evident positive nasal congestion. pharynx normal neck supple  lungs clear/no crackles or wheezes. heart regular in rhythm        Assessment & Plan:  Impression rhinosinusitis likely post viral, discussed with patient. plan antibiotics prescribed. Questions answered. Symptomatic care discussed. warning signs discussed. WSL Post flu/

## 2016-03-06 ENCOUNTER — Other Ambulatory Visit: Payer: Self-pay | Admitting: Nurse Practitioner

## 2016-12-17 ENCOUNTER — Ambulatory Visit (INDEPENDENT_AMBULATORY_CARE_PROVIDER_SITE_OTHER): Payer: BLUE CROSS/BLUE SHIELD | Admitting: Nurse Practitioner

## 2016-12-17 ENCOUNTER — Encounter: Payer: Self-pay | Admitting: Nurse Practitioner

## 2016-12-17 VITALS — BP 120/80 | Ht 65.0 in | Wt 181.0 lb

## 2016-12-17 DIAGNOSIS — N946 Dysmenorrhea, unspecified: Secondary | ICD-10-CM | POA: Diagnosis not present

## 2016-12-17 DIAGNOSIS — N951 Menopausal and female climacteric states: Secondary | ICD-10-CM | POA: Diagnosis not present

## 2016-12-17 MED ORDER — NORETHINDRONE 0.35 MG PO TABS: 1.0000 | ORAL_TABLET | Freq: Every day | ORAL | 11 refills | Status: DC

## 2016-12-17 NOTE — Progress Notes (Signed)
Subjective:  41 yo female see today for reports of hot flashes and mood swings.  Reports symptoms began in her late 6230's and have been progressively worsening.  Pt. Has experienced a great deal of stress for the past several years, but over the last two months she has felt a significant decrease in her stress, improving the severity of her mood swings, but not the frequency.  Experiences no mood swings while on her period, but frequently occur during the remainder of the month.  Hot flashes occur during the night, interrupts her sleep, keeping her fatigued throughout the day.  Has attempted OTC estrovent in the past with mild relief.  Has discussed with GYN with reported testing hormone levels with no interventions per pt. mother experienced menopause in her mid thirties.  LMP was approx. 1 wk ago with dysmenorrhea. Irregular cycles, no heavy bleeding.  Denies any other abnormal bleeding or pain. Married, same sexual partner. Has had BTL.  Objective:   BP 120/80   Ht 5\' 5"  (1.651 m)   Wt 181 lb (82.1 kg)   BMI 30.12 kg/m  Pt. Alert and oriented, NAD. Lungs: CTA, Cardiac: RRR, Abd: Soft, non-tender  Assessment:  Perimenopause  Dysmenorrhea    Plan:  Meds ordered this encounter  Medications  . norethindrone (MICRONOR,CAMILA,ERRIN) 0.35 MG tablet    Sig: Take 1 tablet (0.35 mg total) by mouth daily.    Dispense:  1 Package    Refill:  11    Order Specific Question:   Supervising Provider    Answer:   Riccardo DubinLUKING, WILLIAM S [[id number]]   Symptom care and warning signs reviewed  Reviewed hormonal options Smoking cessation education  Return in about 3 months (around 03/16/2017) for recheck.call back sooner if any problems or concerns

## 2017-03-16 ENCOUNTER — Ambulatory Visit (INDEPENDENT_AMBULATORY_CARE_PROVIDER_SITE_OTHER): Payer: BLUE CROSS/BLUE SHIELD | Admitting: Nurse Practitioner

## 2017-03-16 ENCOUNTER — Encounter: Payer: Self-pay | Admitting: Nurse Practitioner

## 2017-03-16 VITALS — BP 110/74 | Ht 65.0 in | Wt 190.0 lb

## 2017-03-16 DIAGNOSIS — L301 Dyshidrosis [pompholyx]: Secondary | ICD-10-CM

## 2017-03-16 DIAGNOSIS — N951 Menopausal and female climacteric states: Secondary | ICD-10-CM | POA: Diagnosis not present

## 2017-03-16 MED ORDER — MOMETASONE FUROATE 0.1 % EX CREA: 1.0000 "application " | TOPICAL_CREAM | Freq: Every day | CUTANEOUS | 0 refills | Status: DC

## 2017-03-16 NOTE — Progress Notes (Signed)
Subjective:  Presents for recheck on her perimenopause symptoms. Regular cycles, lighter flow. Hot flashes much improved, only 2-3 per month, only at night. History of dyshidrotic eczema on her hands. Has a spot on left hand that has come up again for the second time this month. Pruritic.   Objective:   BP 110/74   Ht  (1.651 m)   Wt 190 lb (86.2 kg)   BMI 31.62 kg/m  NAD. Alert, oriented. Lungs clear. Heart RRR. Dry, pink area lateral left hand with slight flakiness.  Assessment:   Problem List Items Addressed This Visit      Musculoskeletal and Integument   Dyshidrotic eczema     Other   Perimenopause - Primary       Plan:   Meds ordered this encounter  Medications  . mometasone (ELOCON) 0.1 % cream    Sig: Apply 1 application topically daily. Prn eczema    Dispense:  45 g    Refill:  0    Order Specific Question:   Supervising Provider    Answer:   Merlyn Albert [[id number]]   Continue Micronor as directed. Call our office or GYN if any problems.  Call back in 2 weeks if area on left hand persists.

## 2017-12-16 ENCOUNTER — Encounter: Payer: Self-pay | Admitting: Family Medicine

## 2017-12-17 ENCOUNTER — Encounter: Payer: Self-pay | Admitting: Nurse Practitioner

## 2017-12-17 ENCOUNTER — Ambulatory Visit (INDEPENDENT_AMBULATORY_CARE_PROVIDER_SITE_OTHER): Payer: BLUE CROSS/BLUE SHIELD | Admitting: Nurse Practitioner

## 2017-12-17 VITALS — BP 118/76 | Temp 98.2°F | Ht 65.0 in | Wt 186.0 lb

## 2017-12-17 DIAGNOSIS — J019 Acute sinusitis, unspecified: Secondary | ICD-10-CM | POA: Diagnosis not present

## 2017-12-17 DIAGNOSIS — B9689 Other specified bacterial agents as the cause of diseases classified elsewhere: Secondary | ICD-10-CM

## 2017-12-17 MED ORDER — AMOXICILLIN-POT CLAVULANATE 875-125 MG PO TABS: 1.0000 | ORAL_TABLET | Freq: Two times a day (BID) | ORAL | 0 refills | Status: DC

## 2017-12-19 ENCOUNTER — Encounter: Payer: Self-pay | Admitting: Nurse Practitioner

## 2017-12-19 NOTE — Progress Notes (Signed)
Subjective: Presents for complaints of sinus pressure and pain that began yesterday.  Left-sided facial area headache especially along the maxillary area.  No fever sore throat or ear pain.  Occasional nonproductive cough.  Mild wheezing at nighttime, defers need for an inhaler.  Chronic smoker.   Objective:   BP 118/76   Temp 98.2 F (36.8 C) (Oral)   Ht 5\' 5"  (1.651 m)   Wt 186 lb (84.4 kg)   BMI 30.95 kg/m  NAD.  Alert, oriented.  TMs clear effusion, no erythema.  Pharynx injected with green PND noted.  Neck supple with mild soft anterior adenopathy.  Lungs clear.  Heart regular rate and rhythm.  Assessment:  Acute bacterial rhinosinusitis    Plan:   Meds ordered this encounter  Medications  . amoxicillin-clavulanate (AUGMENTIN) 875-125 MG tablet    Sig: Take 1 tablet by mouth 2 (two) times daily.    Dispense:  20 tablet    Refill:  0    Order Specific Question:   Supervising Provider    Answer:   Merlyn AlbertLUKING, WILLIAM S [[id number]]    OTC meds as directed for congestion.  Discussed importance of smoking cessation and options.  Call back if symptoms worsen or persist.

## 2018-03-15 ENCOUNTER — Ambulatory Visit: Payer: BLUE CROSS/BLUE SHIELD | Admitting: Family Medicine

## 2018-03-15 ENCOUNTER — Encounter: Payer: Self-pay | Admitting: Family Medicine

## 2018-03-15 VITALS — BP 122/74 | Temp 98.4°F | Ht 65.0 in | Wt 186.0 lb

## 2018-03-15 DIAGNOSIS — R300 Dysuria: Secondary | ICD-10-CM | POA: Diagnosis not present

## 2018-03-15 DIAGNOSIS — N3000 Acute cystitis without hematuria: Secondary | ICD-10-CM | POA: Diagnosis not present

## 2018-03-15 LAB — POCT URINALYSIS DIPSTICK
PH UA: 5 (ref 5.0–8.0)
Spec Grav, UA: 1.01 (ref 1.010–1.025)

## 2018-03-15 MED ORDER — CIPROFLOXACIN HCL 250 MG PO TABS: 250.0000 mg | ORAL_TABLET | Freq: Two times a day (BID) | ORAL | 0 refills | Status: DC

## 2018-03-15 NOTE — Progress Notes (Signed)
   Subjective:    Patient ID: Breanna Mcguire, female    DOB: [birth year], 42 y.o.   MRN: [id number]  Urinary Tract Infection   This is a new problem. Episode onset: 4 days. Associated symptoms comments: dysuria. Treatments tried: avo.   Results for orders placed or performed in visit on 03/15/18  POCT urinalysis dipstick  Result Value Ref Range   Color, UA     Clarity, UA     Glucose, UA     Bilirubin, UA     Ketones, UA     Spec Grav, UA 1.010 1.010 - 1.025   Blood, UA trace    pH, UA 5.0 5.0 - 8.0   Protein, UA     Urobilinogen, UA  0.2 or 1.0 E.U./dL   Nitrite, UA     Leukocytes, UA 4+ (A) Negative   Appearance     Odor     Frequent urin burning uncomfrotsble   No fevdr or chil  No achey in low back or low abd o  Azo took otc helped some       Review of Systems Testing no headache no chest pain no abdominal pain    Objective:   Physical Exam  Alert active good hydration lungs clear.  Heart regular rate and rhythm.  No CVA tenderness.  Urinalysis 8-10 white blood cells per high-power field      Assessment & Plan:  Impression urinary tract infection plan antibiotics prescribed symptom care discussed warning signs discussed Greater than 50% of this 15 minute face to face visit was spent in counseling and discussion and coordination of care regarding the above diagnosis/diagnosies

## 2018-07-15 ENCOUNTER — Ambulatory Visit: Payer: BLUE CROSS/BLUE SHIELD | Admitting: Family Medicine

## 2018-07-15 ENCOUNTER — Encounter: Payer: Self-pay | Admitting: Family Medicine

## 2018-07-15 VITALS — BP 112/70 | Temp 98.5°F | Ht 65.0 in | Wt 191.8 lb

## 2018-07-15 DIAGNOSIS — L301 Dyshidrosis [pompholyx]: Secondary | ICD-10-CM | POA: Diagnosis not present

## 2018-07-15 MED ORDER — BETAMETHASONE DIPROPIONATE 0.05 % EX OINT: TOPICAL_OINTMENT | Freq: Two times a day (BID) | CUTANEOUS | 3 refills | Status: DC

## 2018-07-15 NOTE — Progress Notes (Signed)
   Subjective:    Patient ID: Breanna Mcguire, female    DOB: 10/13/1976, 42 y.o.   MRN: [id number]  Rash  This is a recurrent problem. Episode onset: over one year. Location: hands and right foot. (Itching and pain) Treatments tried: elocon.    Hands and ft quite itchy and with rash  Patient has long-standing history of tendency towards eczema  Is been using Elocon cream.  In the past it helped but does not now.  Involved on hands and foot.  Does not excessively wash hands   Review of Systems  Skin: Positive for rash.       Objective:   Physical Exam  Alert vitals stable, NAD. Blood pressure good on repeat. HEENT normal. Lungs clear. Heart regular rate and rhythm. Hands bilateral impressive hypertrophic thickening plaque like rash also right lateral foot      Assessment & Plan:  Impression eczema fairly severe.  Plan switch to Diprolene ointment.  Rationale discussed local measures discussed.

## 2018-09-20 ENCOUNTER — Other Ambulatory Visit: Payer: BLUE CROSS/BLUE SHIELD | Admitting: Obstetrics and Gynecology

## 2018-09-23 ENCOUNTER — Other Ambulatory Visit: Payer: BLUE CROSS/BLUE SHIELD | Admitting: Obstetrics and Gynecology

## 2018-10-11 ENCOUNTER — Ambulatory Visit: Payer: BLUE CROSS/BLUE SHIELD | Admitting: Family Medicine

## 2018-10-11 ENCOUNTER — Encounter: Payer: Self-pay | Admitting: Family Medicine

## 2018-10-11 VITALS — BP 106/70 | Ht 65.0 in | Wt 190.0 lb

## 2018-10-11 DIAGNOSIS — L301 Dyshidrosis [pompholyx]: Secondary | ICD-10-CM

## 2018-10-11 MED ORDER — PREDNISONE 10 MG PO TABS: ORAL_TABLET | ORAL | 0 refills | Status: DC

## 2018-10-11 MED ORDER — DOXYCYCLINE HYCLATE 100 MG PO TABS: 100.0000 mg | ORAL_TABLET | Freq: Two times a day (BID) | ORAL | 0 refills | Status: DC

## 2018-10-11 NOTE — Progress Notes (Signed)
   Subjective:    Patient ID: Janene HarveyKatherine W Lenz, female    DOB: 04/25/1976, 42 y.o.   MRN: [id number]  HPI  Patient is here today to follow up on her left hand and right foot rash. This rash has been ongoing for the last two years. She states she thought this was her eczema.This has now become raw looking and she is putting Betamethasone on it with it not helping much.    Patient does give history of eczema in the past.  Also gives history of MRSA within the family  Rash on hand is now sig worse    Review of Systems No headache, no major weight loss or weight gain, no chest pain no back pain abdominal pain no change in bowel habits complete ROS otherwise negative     Objective:   Physical Exam  Alert vitals stable, NAD. Blood pressure good on repeat. HEENT normal. Lungs clear. Heart regular rate and rhythm. Substantial worsening of rash with purulent areas.  Some pustules.  Weeping.  Substantial cracks.  Both on left hand and right foot  Impression worsening eczema.  Discussed.  Will cover infectious component.  Also steroids.  Local measures discussed dermatology referral     Assessment & Plan:

## 2018-11-01 DIAGNOSIS — L400 Psoriasis vulgaris: Secondary | ICD-10-CM | POA: Diagnosis not present

## 2018-11-18 ENCOUNTER — Other Ambulatory Visit (HOSPITAL_COMMUNITY)
Admission: RE | Admit: 2018-11-18 | Discharge: 2018-11-18 | Disposition: A | Discharge status: Home / Self Care | Payer: BLUE CROSS/BLUE SHIELD | Source: Ambulatory Visit | Attending: Obstetrics and Gynecology | Admitting: Obstetrics and Gynecology

## 2018-11-18 ENCOUNTER — Encounter: Payer: Self-pay | Admitting: Obstetrics and Gynecology

## 2018-11-18 ENCOUNTER — Ambulatory Visit (INDEPENDENT_AMBULATORY_CARE_PROVIDER_SITE_OTHER): Payer: BLUE CROSS/BLUE SHIELD | Admitting: Obstetrics and Gynecology

## 2018-11-18 ENCOUNTER — Encounter (INDEPENDENT_AMBULATORY_CARE_PROVIDER_SITE_OTHER): Payer: Self-pay

## 2018-11-18 VITALS — BP 127/78 | HR 81 | Ht 65.0 in | Wt 191.6 lb

## 2018-11-18 DIAGNOSIS — Z01419 Encounter for gynecological examination (general) (routine) without abnormal findings: Secondary | ICD-10-CM | POA: Insufficient documentation

## 2018-11-18 MED ORDER — ESTRADIOL 0.1 MG/24HR TD PTTW: 1.0000 | MEDICATED_PATCH | TRANSDERMAL | 12 refills | Status: DC

## 2018-11-18 MED ORDER — PROGESTERONE MICRONIZED 200 MG PO CAPS
200.0000 mg | ORAL_CAPSULE | Freq: Every day | ORAL | 1 refills | Status: DC
Start: 2018-11-18 — End: 2020-03-21

## 2018-11-18 NOTE — Progress Notes (Signed)
Patient ID: Breanna Mcguire, female   DOB: February 02, 1976, 43 y.o.   MRN: [id number]   Assessment:  1. Annual Gyn Exam 2. Perimenopausal hot flashes and mood swings 3. Psoriasis of hand and foot 4. Vivelle Dot , prometrium 200 mg q d x 2 wk, now and  q 3 months. Plan:  1. pap smear done, next pap due 3 years 2. return annually or prn 3    Annual mammogram advised after age 20 Subjective:  Breanna Mcguire is a 43 y.o. female No obstetric history on file. who presents for annual exam. Patient's last menstrual period was 11/06/2018. The patient has complaints today of hot flashes and mood swings. Her periods are irregular -- sometimes has none, sometimes has two each month. She is interested in stopping her periods and was put on Micronor but it did not work. It also did not help with her mood swings. She sleeps well but occasionally wakes up because of her hot flashes.   She has psoriasis and was given ointment but has not filled the prescription because it was expensive. She has not had a mammogram yet. Denies hx of blood clots. She smokes half a pack of cigarettes each day.  The following portions of the patient's history were reviewed and updated as appropriate: allergies, current medications, past family history, past medical history, past social history, past surgical history and problem list. History reviewed. No pertinent past medical history.  Past Surgical History:  Procedure Laterality Date   LAPAROSCOPIC ABDOMINAL EXPLORATION     TUBAL LIGATION      Current Outpatient Medications:    Nutritional Supplements (ESTROVEN PO), Take by mouth., Disp: , Rfl:    betamethasone dipropionate (DIPROLENE) 0.05 % ointment, Apply topically 2 (two) times daily. (Patient not taking: Reported on 11/18/2018), Disp: 50 g, Rfl: 3   mometasone (ELOCON) 0.1 % cream, Apply 1 application topically daily. Prn eczema (Patient not taking: Reported on 10/11/2018), Disp: 45 g, Rfl: 0  Review of  Systems Constitutional: negative Gastrointestinal: negative Genitourinary: negative  Objective:  BP 127/78 (BP Location: Right Arm, Patient Position: Sitting, Cuff Size: Normal)    Pulse 81    Ht 5\' 5"  (1.651 m)    Wt 191 lb 9.6 oz (86.9 kg)    LMP 11/06/2018    BMI 31.88 kg/m    BMI: Body mass index is 31.88 kg/m.  General Appearance: Alert, appropriate appearance for age. No acute distress HEENT: Grossly normal Neck / Thyroid:  Cardiovascular: RRR; normal S1, S2, no murmur Lungs: CTA bilaterally Back: No CVAT Breast Exam: No dimpling, nipple retraction or discharge. No masses or nodes., Normal to inspection, Normal breast tissue bilaterally and No masses or nodes.No dimpling, nipple retraction or discharge. Gastrointestinal: Soft, non-tender, no masses or organomegaly Pelvic Exam:  Vagina: healthy secretions Cervix: normal Rectovaginal: guaiac negative stool obtained Lymphatic Exam: Non-palpable nodes in neck, clavicular, axillary, or inguinal regions Skin: no rash or abnormalities Neurologic: Normal gait and speech, no tremor  Psychiatric: Alert and oriented, appropriate affect. Extremitiy: large area of psoriasis left palm and foot. Urinalysis:Not done  By signing my name below, I, Pietro Cassis, attest that this documentation has been prepared under the direction and in the presence of Tilda Burrow, MD. Electronically Signed: Pietro Cassis, Medical Scribe. 11/18/18. 10:41 AM.  I personally performed the services described in this documentation, which was SCRIBED in my presence. The recorded information has been reviewed and considered accurate. It has been edited as necessary during review.  Tilda Burrow, MD

## 2018-11-22 LAB — CYTOLOGY - PAP
Adequacy: ABSENT
Diagnosis: NEGATIVE
HPV: NOT DETECTED

## 2018-11-24 ENCOUNTER — Ambulatory Visit (INDEPENDENT_AMBULATORY_CARE_PROVIDER_SITE_OTHER): Payer: BLUE CROSS/BLUE SHIELD | Admitting: Family Medicine

## 2018-11-24 ENCOUNTER — Encounter: Payer: Self-pay | Admitting: Family Medicine

## 2018-11-24 VITALS — BP 110/78 | Ht 64.5 in | Wt 192.0 lb

## 2018-11-24 DIAGNOSIS — Z0000 Encounter for general adult medical examination without abnormal findings: Secondary | ICD-10-CM | POA: Diagnosis not present

## 2018-11-24 NOTE — Progress Notes (Signed)
Subjective:    Patient ID: Breanna Mcguire, female    DOB: [birth year], 43 y.o.   MRN: [id number]  HPI The patient comes in today for a wellness visit.  A review of their health history was completed. A review of medications was also completed.  Had pap at gyn.   Any needed refills; none  Eating habits: not health conscious, working on eating better  Falls/  MVA accidents in past few months: none  Regular exercise: no regular exercise.   Specialist pt sees on regular basis: Dr. Margo Aye for psoriasis. Dr. Emelda Fear for women's health  Preventative health issues were discussed. Regular vision exams. Does not get dental exams d/t lack of insurance.   Has not had mammogram - states she will call and schedule it.   Additional concerns: none  Declines flu vaccine. Has not had tetanus in over 10 years. Declines Tdap.   Current smoker, has cut down, but no desire to quit currently.    Review of Systems  Constitutional: Negative for chills, fatigue, fever and unexpected weight change.  HENT: Negative for congestion, ear pain, sinus pressure, sinus pain and sore throat.   Eyes: Negative for discharge and visual disturbance.  Respiratory: Negative for cough, shortness of breath and wheezing.   Cardiovascular: Negative for chest pain and leg swelling.  Gastrointestinal: Negative for abdominal pain, blood in stool, constipation, diarrhea, nausea and vomiting.  Genitourinary: Negative for difficulty urinating and hematuria.  Skin: Negative for color change.  Neurological: Negative for dizziness, weakness, light-headedness and headaches.  Hematological: Negative for adenopathy.  Psychiatric/Behavioral: Negative for dysphoric mood and suicidal ideas.  All other systems reviewed and are negative.      Objective:   Physical Exam Vitals signs and nursing note reviewed.  Constitutional:      General: She is not in acute distress.    Appearance: She is well-developed.  HENT:     Head:  Normocephalic and atraumatic.     Right Ear: Tympanic membrane normal.     Left Ear: Tympanic membrane normal.     Nose: Nose normal.     Mouth/Throat:     Mouth: Mucous membranes are moist.     Dentition: Abnormal dentition.     Pharynx: Oropharynx is clear. Uvula midline.  Eyes:     General:        Right eye: No discharge.        Left eye: No discharge.     Extraocular Movements: Extraocular movements intact.     Conjunctiva/sclera: Conjunctivae normal.     Pupils: Pupils are equal, round, and reactive to light.  Neck:     Musculoskeletal: Neck supple.     Thyroid: No thyromegaly.  Cardiovascular:     Rate and Rhythm: Normal rate and regular rhythm.     Heart sounds: Normal heart sounds. No murmur.  Pulmonary:     Effort: Pulmonary effort is normal. No respiratory distress.     Breath sounds: Normal breath sounds. No wheezing.  Abdominal:     General: Bowel sounds are normal. There is no distension.     Palpations: Abdomen is soft. There is no mass.     Tenderness: There is no abdominal tenderness.  Genitourinary:    Comments: Breast and GU exams deferred, done at GYN office Musculoskeletal:        General: No deformity.  Lymphadenopathy:     Cervical: No cervical adenopathy.  Skin:    General: Skin is warm and dry.  Neurological:  Mental Status: She is alert and oriented to person, place, and time.     Coordination: Coordination normal.  Psychiatric:        Mood and Affect: Mood normal.           Assessment & Plan:  Routine general medical examination at a health care facility - Plan: Lipid panel, Hepatic function panel, Basic metabolic panel, CBC with Differential/Platelet  Adult wellness-complete.wellness physical was conducted today. Importance of diet and exercise were discussed in detail.  In addition to this a discussion regarding safety was also covered. We also reviewed over immunizations and gave recommendations regarding current immunization needed  for age.   -Declined flu and tdap In addition to this additional areas were also touched on including: Preventative health exams needed:  Colonoscopy N/A Mammogram advised - pt will schedule Pap smear done at GYN  Patient was advised yearly wellness exam. Strongly encouraged regular dental exams. Screening labs ordered, will notify of results.  Encouraged smoking cessation, patient not interested at this time.

## 2018-11-24 NOTE — Patient Instructions (Addendum)
Please call and schedule your screening mammogram.  Recommend you see a dentist regularly for dental health.  Preventive Care 40-64 Years, Female Preventive care refers to lifestyle choices and visits with your health care provider that can promote health and wellness. What does preventive care include?   A yearly physical exam. This is also called an annual well check.  Dental exams once or twice a year.  Routine eye exams. Ask your health care provider how often you should have your eyes checked.  Personal lifestyle choices, including: ? Daily care of your teeth and gums. ? Regular physical activity. ? Eating a healthy diet. ? Avoiding tobacco and drug use. ? Limiting alcohol use. ? Practicing safe sex. ? Taking low-dose aspirin daily starting at age 56. ? Taking vitamin and mineral supplements as recommended by your health care provider. What happens during an annual well check? The services and screenings done by your health care provider during your annual well check will depend on your age, overall health, lifestyle risk factors, and family history of disease. Counseling Your health care provider may ask you questions about your:  Alcohol use.  Tobacco use.  Drug use.  Emotional well-being.  Home and relationship well-being.  Sexual activity.  Eating habits.  Work and work Statistician.  Method of birth control.  Menstrual cycle.  Pregnancy history. Screening You may have the following tests or measurements:  Height, weight, and BMI.  Blood pressure.  Lipid and cholesterol levels. These may be checked every 5 years, or more frequently if you are over 61 years old.  Skin check.  Lung cancer screening. You may have this screening every year starting at age 33 if you have a 30-pack-year history of smoking and currently smoke or have quit within the past 15 years.  Colorectal cancer screening. All adults should have this screening starting at age 70 and  continuing until age 60. Your health care provider may recommend screening at age 52. You will have tests every 1-10 years, depending on your results and the type of screening test. People at increased risk should start screening at an earlier age. Screening tests may include: ? Guaiac-based fecal occult blood testing. ? Fecal immunochemical test (FIT). ? Stool DNA test. ? Virtual colonoscopy. ? Sigmoidoscopy. During this test, a flexible tube with a tiny camera (sigmoidoscope) is used to examine your rectum and lower colon. The sigmoidoscope is inserted through your anus into your rectum and lower colon. ? Colonoscopy. During this test, a long, thin, flexible tube with a tiny camera (colonoscope) is used to examine your entire colon and rectum.  Hepatitis C blood test.  Hepatitis B blood test.  Sexually transmitted disease (STD) testing.  Diabetes screening. This is done by checking your blood sugar (glucose) after you have not eaten for a while (fasting). You may have this done every 1-3 years.  Mammogram. This may be done every 1-2 years. Talk to your health care provider about when you should start having regular mammograms. This may depend on whether you have a family history of breast cancer.  BRCA-related cancer screening. This may be done if you have a family history of breast, ovarian, tubal, or peritoneal cancers.  Pelvic exam and Pap test. This may be done every 3 years starting at age 76. Starting at age 40, this may be done every 5 years if you have a Pap test in combination with an HPV test.  Bone density scan. This is done to screen for osteoporosis. You  may have this scan if you are at high risk for osteoporosis. Discuss your test results, treatment options, and if necessary, the need for more tests with your health care provider. Vaccines Your health care provider may recommend certain vaccines, such as:  Influenza vaccine. This is recommended every year.  Tetanus,  diphtheria, and acellular pertussis (Tdap, Td) vaccine. You may need a Td booster every 10 years.  Varicella vaccine. You may need this if you have not been vaccinated.  Zoster vaccine. You may need this after age 10.  Measles, mumps, and rubella (MMR) vaccine. You may need at least one dose of MMR if you were born in 1957 or later. You may also need a second dose.  Pneumococcal 13-valent conjugate (PCV13) vaccine. You may need this if you have certain conditions and were not previously vaccinated.  Pneumococcal polysaccharide (PPSV23) vaccine. You may need one or two doses if you smoke cigarettes or if you have certain conditions.  Meningococcal vaccine. You may need this if you have certain conditions.  Hepatitis A vaccine. You may need this if you have certain conditions or if you travel or work in places where you may be exposed to hepatitis A.  Hepatitis B vaccine. You may need this if you have certain conditions or if you travel or work in places where you may be exposed to hepatitis B.  Haemophilus influenzae type b (Hib) vaccine. You may need this if you have certain conditions. Talk to your health care provider about which screenings and vaccines you need and how often you need them. This information is not intended to replace advice given to you by your health care provider. Make sure you discuss any questions you have with your health care provider. Document Released: 11/30/2015 Document Revised: 12/24/2017 Document Reviewed: 09/04/2015 Elsevier Interactive Patient Education  2019 [place], Adult Preventive dental care includes seeing a dentist regularly and practicing good dental care (oral hygiene) at home. These actions can help to prevent cavities and other tooth problems, root canal problems, gum disease (gingivitis), and tooth loss. Regular dental exams may also help your health care provider to diagnose other medical problems. Many diseases,  including mouth cancers, have early signs that can be found during a preventive dental care visit. What can I expect during my dental visits? Many adults see their dentist one or two times each year for oral exams and cleanings. Talk with your dentist about the best preventive dental care schedule for you. At your visit, your dentist may ask you about: Your overall health and diet. Any new symptoms, such as: Bleeding gums. Mouth, tooth, or jaw pain. Your dentist will do a mouth (oral) exam to check for: Cavities. Gingivitis or other problems. Signs of cancer. Neck swelling or lumps. Abnormal jaw movement or pain in the jaw joint. You may also have: Dental X-rays. Your teeth cleaned. If you have an early problem, like a cavity, your dentist will schedule time for you to get treatment. If you have a tooth root problem, gum disease, or a sign of another disease, your dentist may send you to see another health care provider for care. How can I care for my teeth at home? Brush with an approved fluoride toothpaste every morning and night. If possible, brush within 10 minutes after every meal. Floss one time every day. Periodically check your teeth for white or brown spots after brushing. These may be signs of cavities. Check your gums for swelling or  bleeding. These may be signs of gum disease, such as gingivitis or periodontitis. Make sure your diet includes plenty of fruits, vegetables, milk and dairy products, whole grains, and proteins. Do not eat a lot of starchy foods or foods with added sugar. Talk with your health care provider if you have questions about following a healthy diet. Avoid sodas, sugary snacks, and sticky candies. Do not smoke. Do not get mouth piercings. If you have tooth or gum pain, gargle with a salt-water mixture 3-4 times per day or as needed. To make a salt-water mixture, completely dissolve -1 tsp of salt in 1 cup of warm water. Take over-the counter and  prescription medicines only as told by your dentist. If you have a permanent tooth knocked out: Find the tooth. Pick it up by the top (crown) with a tissue or gauze. Wash the tooth for no more than 10 seconds under cold, running water. Try to put the tooth back into the gum socket. Put the tooth in a glass of milk if you cannot get it back in place. Go to your dentist right away. Take the tooth with you. When should I seek medical care? Call your dentist if you have: Gum, tooth, or jaw pain. Red, swollen, or bleeding gums. A tooth or teeth that are very sensitive to hot or cold. Very bad breath. A problem with a filling, crown, implant, or denture. A broken or loose tooth. A growth or sore in your mouth that is not going away. Where to find more information American Dental Association: http://clayton-rivera.info/ This information is not intended to replace advice given to you by your health care provider. Make sure you discuss any questions you have with your health care provider. Document Released: 07/25/2015 Document Revised: 04/10/2016 Document Reviewed: 04/17/2015 Elsevier Interactive Patient Education  2019 [employer].

## 2018-12-13 DIAGNOSIS — L400 Psoriasis vulgaris: Secondary | ICD-10-CM | POA: Diagnosis not present

## 2019-01-06 DIAGNOSIS — L400 Psoriasis vulgaris: Secondary | ICD-10-CM | POA: Diagnosis not present

## 2019-01-17 DIAGNOSIS — Z0000 Encounter for general adult medical examination without abnormal findings: Secondary | ICD-10-CM | POA: Diagnosis not present

## 2019-01-18 LAB — BASIC METABOLIC PANEL
BUN/Creatinine Ratio: 8 — ABNORMAL LOW (ref 9–23)
BUN: 6 mg/dL (ref 6–24)
CO2: 22 mmol/L (ref 20–29)
CREATININE: 0.79 mg/dL (ref 0.57–1.00)
Calcium: 9.4 mg/dL (ref 8.7–10.2)
Chloride: 105 mmol/L (ref 96–106)
GFR calc Af Amer: 105 mL/min/{1.73_m2} (ref >59)
GFR, EST NON AFRICAN AMERICAN: 91 mL/min/{1.73_m2} (ref >59)
Glucose: 81 mg/dL (ref 65–99)
Potassium: 4.3 mmol/L (ref 3.5–5.2)
Sodium: 140 mmol/L (ref 134–144)

## 2019-01-18 LAB — CBC WITH DIFFERENTIAL/PLATELET
BASOS: 1 %
Basophils Absolute: 0.1 10*3/uL (ref 0.0–0.2)
EOS (ABSOLUTE): 0.1 10*3/uL (ref 0.0–0.4)
EOS: 1 %
HEMOGLOBIN: 14.2 g/dL (ref 11.1–15.9)
Hematocrit: 42.1 % (ref 34.0–46.6)
IMMATURE GRANS (ABS): 0.1 10*3/uL (ref 0.0–0.1)
IMMATURE GRANULOCYTES: 1 %
Lymphocytes Absolute: 2.4 10*3/uL (ref 0.7–3.1)
Lymphs: 25 %
MCH: 29.5 pg (ref 26.6–33.0)
MCHC: 33.7 g/dL (ref 31.5–35.7)
MCV: 87 fL (ref 79–97)
MONOCYTES: 4 %
MONOS ABS: 0.4 10*3/uL (ref 0.1–0.9)
NEUTROS PCT: 68 %
Neutrophils Absolute: 6.4 10*3/uL (ref 1.4–7.0)
Platelets: 258 10*3/uL (ref 150–450)
RBC: 4.82 x10E6/uL (ref 3.77–5.28)
RDW: 12.9 % (ref 11.7–15.4)
WBC: 9.4 10*3/uL (ref 3.4–10.8)

## 2019-01-18 LAB — HEPATIC FUNCTION PANEL
ALT: 11 IU/L (ref 0–32)
AST: 12 IU/L (ref 0–40)
Albumin: 4.2 g/dL (ref 3.8–4.8)
Alkaline Phosphatase: 103 IU/L (ref 39–117)
BILIRUBIN, DIRECT: 0.12 mg/dL (ref 0.00–0.40)
Bilirubin Total: 0.4 mg/dL (ref 0.0–1.2)
Total Protein: 6.7 g/dL (ref 6.0–8.5)

## 2019-01-18 LAB — LIPID PANEL
Chol/HDL Ratio: 4.1 ratio (ref 0.0–4.4)
Cholesterol, Total: 214 mg/dL — ABNORMAL HIGH (ref 100–199)
HDL: 52 mg/dL (ref >39)
LDL Calculated: 141 mg/dL — ABNORMAL HIGH (ref 0–99)
TRIGLYCERIDES: 106 mg/dL (ref 0–149)
VLDL Cholesterol Cal: 21 mg/dL (ref 5–40)

## 2019-02-14 DIAGNOSIS — Z79899 Other long term (current) drug therapy: Secondary | ICD-10-CM | POA: Diagnosis not present

## 2019-02-14 DIAGNOSIS — L400 Psoriasis vulgaris: Secondary | ICD-10-CM | POA: Diagnosis not present

## 2019-02-17 ENCOUNTER — Encounter: Payer: Self-pay | Admitting: Obstetrics and Gynecology

## 2019-02-17 ENCOUNTER — Ambulatory Visit (INDEPENDENT_AMBULATORY_CARE_PROVIDER_SITE_OTHER): Payer: BLUE CROSS/BLUE SHIELD | Admitting: Obstetrics and Gynecology

## 2019-02-17 ENCOUNTER — Other Ambulatory Visit: Payer: Self-pay

## 2019-02-17 DIAGNOSIS — L403 Pustulosis palmaris et plantaris: Secondary | ICD-10-CM | POA: Diagnosis not present

## 2019-02-17 DIAGNOSIS — N951 Menopausal and female climacteric states: Secondary | ICD-10-CM | POA: Diagnosis not present

## 2019-02-17 NOTE — Progress Notes (Signed)
Patient ID: Breanna Mcguire, female   DOB: Apr 22, 1976, 43 y.o.   MRN: [id number]   TELEHEALTH VIRTUAL GYNECOLOGY VISIT ENCOUNTER NOTE  I connected with Breanna Mcguire on 02/17/2019 at  1:30 PM EDT by telephone at home and verified that I am speaking with the correct person using two identifiers.   I discussed the limitations, risks, security and privacy concerns of performing an evaluation and management service by telephone and the availability of in person appointments. I also discussed with the patient that there may be a patient responsible charge related to this service. The patient expressed understanding and agreed to proceed.   History:  Breanna Mcguire is a 43 y.o. G2P0 female being evaluated today for medication management for vivelle-dot & pometrium.  Is doing well overall but the day before she has to switch the patches she has some night sweat. It wakes her up at night and disturbs her sleep cycle. Says it is a minor nuisance but otherwise bearable.  Hasn't stopped having periods, they have become more regular and only having one period every month. Has reduced her smoking due to not being able to leave home much due to covid19 stay at home order. She denies any other abnormal vaginal discharge, bleeding, pelvic pain or other concerns.       No past medical history on file. Past Surgical History:  Procedure Laterality Date  . LAPAROSCOPIC ABDOMINAL EXPLORATION    . TUBAL LIGATION     The following portions of the patient's history were reviewed and updated as appropriate: allergies, current medications, past family history, past medical history, past social history, past surgical history and problem list.    Review of Systems:  Pertinent items noted in HPI and remainder of comprehensive ROS otherwise negative.  Physical Exam:  Physical exam deferred due to nature of the encounter  Labs and Imaging No results found for this or any previous visit (from the past 336  hour(s)). No results found.    Assessment and Plan:     A:perimenopausal hot flashes (vasomotor symptoms)      P:  1. Prometrium only when w/o menses more than 3 months 2 continue Vivelle Dot at current dose. Has Rx for a yr. I discussed the assessment and treatment plan with the patient. The patient was provided an opportunity to ask questions and all were answered. The patient agreed with the plan and demonstrated an understanding of the instructions.   The patient was advised to call back or seek an in-person evaluation/go to the ED if the symptoms worsen or if the condition fails to improve as anticipated.  I provided 15 minutes of non-face-to-face time during this encounter.   By signing my name below, I, Arnette Norris, attest that this documentation has been prepared under the direction and in the presence of Tilda Burrow, MD. Electronically Signed: Arnette Norris Medical Scribe. 02/17/19. 1:49 PM.  I personally performed the services described in this documentation, which was SCRIBED in my presence. The recorded information has been reviewed and considered accurate. It has been edited as necessary during review. Tilda Burrow, MD

## 2019-04-15 DIAGNOSIS — Z79899 Other long term (current) drug therapy: Secondary | ICD-10-CM | POA: Diagnosis not present

## 2019-04-15 DIAGNOSIS — L400 Psoriasis vulgaris: Secondary | ICD-10-CM | POA: Diagnosis not present

## 2019-04-21 DIAGNOSIS — L403 Pustulosis palmaris et plantaris: Secondary | ICD-10-CM | POA: Diagnosis not present

## 2019-06-02 DIAGNOSIS — L403 Pustulosis palmaris et plantaris: Secondary | ICD-10-CM | POA: Diagnosis not present

## 2019-08-01 ENCOUNTER — Encounter: Payer: Self-pay | Admitting: Family Medicine

## 2019-08-01 ENCOUNTER — Other Ambulatory Visit: Payer: Self-pay

## 2019-08-01 ENCOUNTER — Ambulatory Visit (INDEPENDENT_AMBULATORY_CARE_PROVIDER_SITE_OTHER): Payer: BC Managed Care – PPO | Admitting: Family Medicine

## 2019-08-01 DIAGNOSIS — J019 Acute sinusitis, unspecified: Secondary | ICD-10-CM

## 2019-08-01 DIAGNOSIS — B9689 Other specified bacterial agents as the cause of diseases classified elsewhere: Secondary | ICD-10-CM

## 2019-08-01 MED ORDER — AMOXICILLIN-POT CLAVULANATE 875-125 MG PO TABS: ORAL_TABLET | ORAL | 0 refills | Status: DC

## 2019-08-01 NOTE — Progress Notes (Signed)
   Subjective:  audio  Patient ID: Breanna Mcguire, female    DOB: 02-03-1976, 43 y.o.   MRN: [id number]  Sinusitis This is a new problem. Episode onset: left ear pain began last night; sinus issues began a couple days ago  Associated symptoms include ear pain. (Sinus drainage) Treatments tried: Motrin. The treatment provided no relief.   Virtual Visit via Video Note  I connected with Breanna Mcguire on 08/01/19 at 10:00 AM EDT by a video enabled telemedicine application and verified that I am speaking with the correct person using two identifiers.  Location: Patient: home Provider: office   I discussed the limitations of evaluation and management by telemedicine and the availability of in person appointments. The patient expressed understanding and agreed to proceed.  History of Present Illness:    Observations/Objective:   Assessment and Plan:   Follow Up Instructions:    I discussed the assessment and treatment plan with the patient. The patient was provided an opportunity to ask questions and all were answered. The patient agreed with the plan and demonstrated an understanding of the instructions.   The patient was advised to call back or seek an in-person evaluation if the symptoms worsen or if the condition fails to improve as anticipated.  I provided 19minutes of non-face-to-face time during this encounter.   Vicente Males, LPN  Hurts in the ear  Feels pressure  Pos sinus cong breathed a lot of dust  Taking some motrin  Patient states no known exposures to anyone else with a virus  Review of Systems  HENT: Positive for ear pain.        Objective:   Physical Exam   Virtual     Assessment & Plan:  Impression probable early sinusitis with element of otitis media.  Discussed.  Antibiotics prescribed.  Symptom care discussed.  Warning signs discussed as far as further development of any lower respiratory symptoms

## 2019-09-16 ENCOUNTER — Telehealth: Payer: Self-pay | Admitting: Family Medicine

## 2019-09-16 NOTE — Telephone Encounter (Signed)
Patient dropped off a form for her work.  Last physical was 11/24/2018. Form put in nurse's box.

## 2019-12-19 ENCOUNTER — Encounter: Payer: Self-pay | Admitting: Family Medicine

## 2020-03-21 ENCOUNTER — Other Ambulatory Visit: Payer: Self-pay

## 2020-03-21 ENCOUNTER — Ambulatory Visit (INDEPENDENT_AMBULATORY_CARE_PROVIDER_SITE_OTHER): Payer: BC Managed Care – PPO | Admitting: Family Medicine

## 2020-03-21 ENCOUNTER — Encounter: Payer: Self-pay | Admitting: Family Medicine

## 2020-03-21 VITALS — BP 128/80 | Temp 98.1°F | Ht 65.0 in | Wt 195.6 lb

## 2020-03-21 DIAGNOSIS — Z131 Encounter for screening for diabetes mellitus: Secondary | ICD-10-CM | POA: Diagnosis not present

## 2020-03-21 DIAGNOSIS — Z13220 Encounter for screening for lipoid disorders: Secondary | ICD-10-CM

## 2020-03-21 DIAGNOSIS — Z0000 Encounter for general adult medical examination without abnormal findings: Secondary | ICD-10-CM

## 2020-03-21 DIAGNOSIS — R5383 Other fatigue: Secondary | ICD-10-CM

## 2020-03-21 NOTE — Progress Notes (Signed)
   Subjective:    Patient ID: Breanna Mcguire, female    DOB: [birth year], 44 y.o.   MRN: [id number]  HPI  The patient comes in today for a wellness visit.    A review of their health history was completed.  A review of medications was also completed.  Any needed refills; no  Eating habits: ok  Falls/  MVA accidents in past few months: none  Regular exercise: not much  Specialist pt sees on regular basis: GYN  Preventative health issues were discussed.   Additional concerns: joint pain   Not walking int he past except soe walking on ccasion  Gets joint pain with achiness in the foot and in the hands  Usually  Deals with the arthirtis pain    No known major arthritis prob with the family   and does not take meds for it   Declines flu shot, does not want it  Pt not inclined to get the covid vaccine   Watching fried foods and greasy foods, trying to cut dosn   One half pack of cigarettes per day since age 12  Stayed off smokes for both pregnancies  Trying t cut down, but not quitting yet   Review of Systems No headache, no major weight loss or weight gain, no chest pain no back pain abdominal pain no change in bowel habits complete ROS otherwise negative     Objective:   Physical Exam Vitals reviewed.  Constitutional:      Appearance: She is well-developed.  HENT:     Head: Normocephalic.     Right Ear: External ear normal.     Left Ear: External ear normal.  Eyes:     Pupils: Pupils are equal, round, and reactive to light.  Neck:     Thyroid: No thyromegaly.  Cardiovascular:     Rate and Rhythm: Normal rate and regular rhythm.     Heart sounds: Normal heart sounds. No murmur.  Pulmonary:     Effort: Pulmonary effort is normal. No respiratory distress.     Breath sounds: Normal breath sounds. No wheezing.  Abdominal:     General: Bowel sounds are normal. There is no distension.     Palpations: Abdomen is soft. There is no mass.   Tenderness: There is no abdominal tenderness.  Musculoskeletal:        General: No tenderness. Normal range of motion.     Cervical back: Normal range of motion.  Lymphadenopathy:     Cervical: No cervical adenopathy.  Skin:    General: Skin is warm and dry.  Neurological:     Mental Status: She is alert and oriented to person, place, and time.     Motor: No abnormal muscle tone.  Psychiatric:        Behavior: Behavior normal.           Assessment & Plan:  Impression wellness exam.  Patient states she [facility]  However on further history has not had a mammogram.  Discussion held.  We will help her schedule I.  Diet discussed.  Exercise discussed.  Covid vaccines encouraged.  Smoking cessation discussed  2.  Joint pain.  Nonspecific.  Nothing evident on physical  Appropriate blood work diet exercise discussed await mammoresults

## 2020-07-31 DIAGNOSIS — Z131 Encounter for screening for diabetes mellitus: Secondary | ICD-10-CM | POA: Diagnosis not present

## 2020-07-31 DIAGNOSIS — R5383 Other fatigue: Secondary | ICD-10-CM | POA: Diagnosis not present

## 2020-07-31 DIAGNOSIS — Z13220 Encounter for screening for lipoid disorders: Secondary | ICD-10-CM | POA: Diagnosis not present

## 2020-08-01 ENCOUNTER — Other Ambulatory Visit (HOSPITAL_COMMUNITY): Payer: Self-pay | Admitting: Family Medicine

## 2020-08-01 ENCOUNTER — Other Ambulatory Visit: Payer: Self-pay | Admitting: Family Medicine

## 2020-08-01 DIAGNOSIS — Z1231 Encounter for screening mammogram for malignant neoplasm of breast: Secondary | ICD-10-CM

## 2020-08-01 DIAGNOSIS — Z13220 Encounter for screening for lipoid disorders: Secondary | ICD-10-CM

## 2020-08-01 DIAGNOSIS — R5383 Other fatigue: Secondary | ICD-10-CM

## 2020-08-01 LAB — HEPATIC FUNCTION PANEL
ALT: 15 IU/L (ref 0–32)
AST: 14 IU/L (ref 0–40)
Albumin: 4.3 g/dL (ref 3.8–4.8)
Alkaline Phosphatase: 111 IU/L (ref 44–121)
Bilirubin Total: 0.3 mg/dL (ref 0.0–1.2)
Bilirubin, Direct: 0.1 mg/dL (ref 0.00–0.40)
Total Protein: 7.1 g/dL (ref 6.0–8.5)

## 2020-08-01 LAB — CBC WITH DIFFERENTIAL/PLATELET
Basophils Absolute: 0.1 10*3/uL (ref 0.0–0.2)
Basos: 1 %
EOS (ABSOLUTE): 0.2 10*3/uL (ref 0.0–0.4)
Eos: 2 %
Hematocrit: 42.5 % (ref 34.0–46.6)
Hemoglobin: 14 g/dL (ref 11.1–15.9)
Immature Grans (Abs): 0.1 10*3/uL (ref 0.0–0.1)
Immature Granulocytes: 1 %
Lymphocytes Absolute: 2.8 10*3/uL (ref 0.7–3.1)
Lymphs: 25 %
MCH: 29.3 pg (ref 26.6–33.0)
MCHC: 32.9 g/dL (ref 31.5–35.7)
MCV: 89 fL (ref 79–97)
Monocytes Absolute: 0.6 10*3/uL (ref 0.1–0.9)
Monocytes: 6 %
Neutrophils Absolute: 7.5 10*3/uL — ABNORMAL HIGH (ref 1.4–7.0)
Neutrophils: 65 %
Platelets: 250 10*3/uL (ref 150–450)
RBC: 4.78 x10E6/uL (ref 3.77–5.28)
RDW: 13.1 % (ref 11.7–15.4)
WBC: 11.3 10*3/uL — ABNORMAL HIGH (ref 3.4–10.8)

## 2020-08-01 LAB — LIPID PANEL
Chol/HDL Ratio: 4.8 ratio — ABNORMAL HIGH (ref 0.0–4.4)
Cholesterol, Total: 248 mg/dL — ABNORMAL HIGH (ref 100–199)
HDL: 52 mg/dL (ref >39)
LDL Chol Calc (NIH): 174 mg/dL — ABNORMAL HIGH (ref 0–99)
Triglycerides: 123 mg/dL (ref 0–149)
VLDL Cholesterol Cal: 22 mg/dL (ref 5–40)

## 2020-08-01 LAB — BASIC METABOLIC PANEL
BUN/Creatinine Ratio: 17 (ref 9–23)
BUN: 12 mg/dL (ref 6–24)
CO2: 26 mmol/L (ref 20–29)
Calcium: 9.6 mg/dL (ref 8.7–10.2)
Chloride: 100 mmol/L (ref 96–106)
Creatinine, Ser: 0.69 mg/dL (ref 0.57–1.00)
GFR calc Af Amer: 121 mL/min/{1.73_m2} (ref >59)
GFR calc non Af Amer: 105 mL/min/{1.73_m2} (ref >59)
Glucose: 92 mg/dL (ref 65–99)
Potassium: 4 mmol/L (ref 3.5–5.2)
Sodium: 139 mmol/L (ref 134–144)

## 2020-08-06 ENCOUNTER — Encounter: Payer: Self-pay | Admitting: Family Medicine

## 2020-08-06 ENCOUNTER — Ambulatory Visit (INDEPENDENT_AMBULATORY_CARE_PROVIDER_SITE_OTHER): Payer: BC Managed Care – PPO | Admitting: Family Medicine

## 2020-08-06 ENCOUNTER — Other Ambulatory Visit: Payer: Self-pay

## 2020-08-06 VITALS — HR 93 | Temp 99.5°F | Resp 16

## 2020-08-06 DIAGNOSIS — J069 Acute upper respiratory infection, unspecified: Secondary | ICD-10-CM | POA: Diagnosis not present

## 2020-08-06 NOTE — Progress Notes (Signed)
Patient ID: Breanna Mcguire, female    DOB: [birth year], 44 y.o.   MRN: [id number]   Chief Complaint  Patient presents with   Cough   Subjective:    Cough This is a new problem. The current episode started yesterday. Cough characteristics: productive at times. Pertinent negatives include no chest pain, chills, ear pain, fever, headaches, postnasal drip, rash, rhinorrhea, sore throat, shortness of breath or wheezing. She has tried nothing for the symptoms.   Mostly dry cough.  No fever, ear pain, or sore throat.  Has sick contact with son at home with URI symptoms. No known covid contacts. Not had covid vaccine. No meds for the coughing. She is a smoker.   Medical History Breanna Mcguire has a past medical history of Plaque psoriasis.   Outpatient Encounter Medications as of 08/06/2020  Medication Sig   Nutritional Supplements (ESTROVEN PO) Take by mouth. (Patient not taking: Reported on 08/06/2020)   No facility-administered encounter medications on file as of 08/06/2020.     Review of Systems  Constitutional: Negative for chills and fever.  HENT: Negative for congestion, ear pain, postnasal drip, rhinorrhea, sinus pressure, sinus pain and sore throat.   Respiratory: Positive for cough. Negative for shortness of breath and wheezing.   Cardiovascular: Negative for chest pain and leg swelling.  Gastrointestinal: Negative for abdominal pain, diarrhea, nausea and vomiting.  Genitourinary: Negative for dysuria and frequency.  Musculoskeletal: Negative for arthralgias and back pain.  Skin: Negative for rash.  Neurological: Negative for dizziness, weakness and headaches.     Vitals Pulse 93    Temp 99.5 F (37.5 C)    Resp 16    SpO2 97%   Objective:   Physical Exam Vitals and nursing note reviewed.  Constitutional:      General: She is not in acute distress.    Appearance: Normal appearance. She is not toxic-appearing.  HENT:     Head: Normocephalic and atraumatic.      Right Ear: Tympanic membrane, ear canal and external ear normal.     Left Ear: Tympanic membrane, ear canal and external ear normal.     Nose: Nose normal. No congestion or rhinorrhea.     Mouth/Throat:     Mouth: Mucous membranes are moist.     Pharynx: Oropharynx is clear. No oropharyngeal exudate or posterior oropharyngeal erythema.  Eyes:     Extraocular Movements: Extraocular movements intact.     Conjunctiva/sclera: Conjunctivae normal.     Pupils: Pupils are equal, round, and reactive to light.  Cardiovascular:     Pulses: Normal pulses.     Heart sounds: Normal heart sounds. No murmur heard.   Pulmonary:     Effort: Pulmonary effort is normal. No respiratory distress.     Breath sounds: No wheezing, rhonchi or rales.  Musculoskeletal:     Cervical back: Normal range of motion.  Lymphadenopathy:     Cervical: No cervical adenopathy.  Skin:    General: Skin is warm and dry.     Findings: No rash.  Neurological:     General: No focal deficit present.     Mental Status: She is alert and oriented to person, place, and time.     Cranial Nerves: No cranial nerve deficit.  Psychiatric:        Mood and Affect: Mood normal.        Behavior: Behavior normal.      Assessment and Plan   1. Viral upper respiratory tract infection - Novel  Coronavirus, NAA ([facility])  Advising symptomatic tx for viral illness. Reviewed usual course of viral illness.  Usually run course of 7-10 days.  increase fluids and call if not improving in next 2-3 days.  Ordered- covid testing.  mucinex -bid with increase in fluids.  Flonase- for congestion. Tylenol/ibuprofen prn for bodyaches/fever.  If worsening sob or coughing, to call or rto.  Pt in agreement with plan. F/u prn.

## 2020-08-06 NOTE — Progress Notes (Signed)
1399 ° °

## 2020-08-07 DIAGNOSIS — R05 Cough: Secondary | ICD-10-CM | POA: Diagnosis not present

## 2020-08-08 LAB — NOVEL CORONAVIRUS, NAA: SARS-CoV-2, NAA: NOT DETECTED

## 2020-08-08 LAB — SARS-COV-2, NAA 2 DAY TAT

## 2020-08-09 ENCOUNTER — Ambulatory Visit (HOSPITAL_COMMUNITY): Payer: BLUE CROSS/BLUE SHIELD

## 2020-09-12 ENCOUNTER — Ambulatory Visit (HOSPITAL_COMMUNITY): Payer: Self-pay

## 2020-09-26 ENCOUNTER — Other Ambulatory Visit: Payer: Self-pay

## 2020-09-26 ENCOUNTER — Ambulatory Visit (HOSPITAL_COMMUNITY)
Admission: RE | Admit: 2020-09-26 | Discharge: 2020-09-26 | Disposition: A | Discharge status: Home / Self Care | Payer: BC Managed Care – PPO | Source: Ambulatory Visit | Attending: Family Medicine | Admitting: Family Medicine

## 2020-09-26 DIAGNOSIS — Z1231 Encounter for screening mammogram for malignant neoplasm of breast: Secondary | ICD-10-CM | POA: Diagnosis not present

## 2020-09-28 ENCOUNTER — Other Ambulatory Visit (HOSPITAL_COMMUNITY): Payer: Self-pay | Admitting: Family Medicine

## 2020-09-28 ENCOUNTER — Other Ambulatory Visit: Payer: Self-pay

## 2020-09-28 ENCOUNTER — Other Ambulatory Visit: Payer: Self-pay | Admitting: Family Medicine

## 2020-09-28 DIAGNOSIS — R928 Other abnormal and inconclusive findings on diagnostic imaging of breast: Secondary | ICD-10-CM

## 2020-09-28 DIAGNOSIS — N6489 Other specified disorders of breast: Secondary | ICD-10-CM

## 2020-10-19 ENCOUNTER — Ambulatory Visit (HOSPITAL_COMMUNITY): Admission: RE | Admit: 2020-10-19 | Payer: BC Managed Care – PPO | Source: Ambulatory Visit

## 2020-10-19 ENCOUNTER — Ambulatory Visit (HOSPITAL_COMMUNITY)
Admission: RE | Admit: 2020-10-19 | Discharge: 2020-10-19 | Disposition: A | Discharge status: Home / Self Care | Payer: BC Managed Care – PPO | Source: Ambulatory Visit | Attending: Family Medicine | Admitting: Family Medicine

## 2020-10-19 ENCOUNTER — Other Ambulatory Visit: Payer: Self-pay

## 2020-10-19 ENCOUNTER — Encounter (HOSPITAL_COMMUNITY): Payer: Self-pay

## 2020-10-19 DIAGNOSIS — R928 Other abnormal and inconclusive findings on diagnostic imaging of breast: Secondary | ICD-10-CM | POA: Diagnosis not present

## 2020-10-19 DIAGNOSIS — N6489 Other specified disorders of breast: Secondary | ICD-10-CM | POA: Diagnosis not present

## 2020-10-19 DIAGNOSIS — R922 Inconclusive mammogram: Secondary | ICD-10-CM | POA: Diagnosis not present

## 2020-10-25 DIAGNOSIS — R5383 Other fatigue: Secondary | ICD-10-CM | POA: Diagnosis not present

## 2020-10-25 DIAGNOSIS — Z13220 Encounter for screening for lipoid disorders: Secondary | ICD-10-CM | POA: Diagnosis not present

## 2020-10-26 LAB — CBC WITH DIFFERENTIAL/PLATELET
Basophils Absolute: 0.1 10*3/uL (ref 0.0–0.2)
Basos: 1 %
EOS (ABSOLUTE): 0.2 10*3/uL (ref 0.0–0.4)
Eos: 2 %
Hematocrit: 41.4 % (ref 34.0–46.6)
Hemoglobin: 14.2 g/dL (ref 11.1–15.9)
Immature Grans (Abs): 0 10*3/uL (ref 0.0–0.1)
Immature Granulocytes: 0 %
Lymphocytes Absolute: 2.3 10*3/uL (ref 0.7–3.1)
Lymphs: 23 %
MCH: 29.4 pg (ref 26.6–33.0)
MCHC: 34.3 g/dL (ref 31.5–35.7)
MCV: 86 fL (ref 79–97)
Monocytes Absolute: 0.6 10*3/uL (ref 0.1–0.9)
Monocytes: 6 %
Neutrophils Absolute: 6.7 10*3/uL (ref 1.4–7.0)
Neutrophils: 68 %
Platelets: 270 10*3/uL (ref 150–450)
RBC: 4.83 x10E6/uL (ref 3.77–5.28)
RDW: 12.5 % (ref 11.7–15.4)
WBC: 9.9 10*3/uL (ref 3.4–10.8)

## 2020-10-26 LAB — LIPID PANEL
Chol/HDL Ratio: 5 ratio — ABNORMAL HIGH (ref 0.0–4.4)
Cholesterol, Total: 222 mg/dL — ABNORMAL HIGH (ref 100–199)
HDL: 44 mg/dL (ref >39)
LDL Chol Calc (NIH): 154 mg/dL — ABNORMAL HIGH (ref 0–99)
Triglycerides: 130 mg/dL (ref 0–149)
VLDL Cholesterol Cal: 24 mg/dL (ref 5–40)

## 2020-10-31 ENCOUNTER — Ambulatory Visit (INDEPENDENT_AMBULATORY_CARE_PROVIDER_SITE_OTHER): Payer: BC Managed Care – PPO | Admitting: Family Medicine

## 2020-10-31 ENCOUNTER — Encounter: Payer: Self-pay | Admitting: Family Medicine

## 2020-10-31 ENCOUNTER — Other Ambulatory Visit: Payer: Self-pay

## 2020-10-31 VITALS — BP 122/64 | HR 84 | Temp 96.9°F | Ht 65.0 in | Wt 197.6 lb

## 2020-10-31 DIAGNOSIS — K582 Mixed irritable bowel syndrome: Secondary | ICD-10-CM | POA: Diagnosis not present

## 2020-10-31 DIAGNOSIS — L409 Psoriasis, unspecified: Secondary | ICD-10-CM | POA: Diagnosis not present

## 2020-10-31 DIAGNOSIS — E785 Hyperlipidemia, unspecified: Secondary | ICD-10-CM | POA: Diagnosis not present

## 2020-10-31 NOTE — Progress Notes (Signed)
Patient ID: Breanna Mcguire, female    DOB: Aug 03, 1976, 44 y.o.   MRN: [id number]   Chief Complaint  Patient presents with  . Follow-up  . Psoriasis   Subjective:    HPI   Having concerns with IBS.  Has both constipation and diarrhea.   Has some hot flashes, but not wanting to have HRT.  Seen by Dr. Margo Aye in the past has psoriasis. They wanted her to take methotrexate for 6 months, but pt didn't want it.  Now doing tea tree oil and coconut oil.  Pt was worried about taking methotrexate due to being in pandemic. Cost issues with tx for psoriasis.  Pt not able to have steroids due to her skin conditions.  Slight elevated cholesterol.    H/o psoriasis- seen by dr. Margo Aye in past. Stable now. On heel and hands. Using cerve psoriasis cleaner. Working well.   Medical History Breanna Mcguire has a past medical history of Plaque psoriasis.   Outpatient Encounter Medications as of 10/31/2020  Medication Sig  . [DISCONTINUED] Nutritional Supplements (ESTROVEN PO) Take by mouth. (Patient not taking: Reported on 08/06/2020)   No facility-administered encounter medications on file as of 10/31/2020.     Review of Systems  Constitutional: Negative for chills and fever.       +night sweats/hot flashes  HENT: Negative for congestion, rhinorrhea and sore throat.   Respiratory: Negative for cough, shortness of breath and wheezing.   Cardiovascular: Negative for chest pain and leg swelling.  Gastrointestinal: Negative for abdominal pain, diarrhea, nausea and vomiting.  Genitourinary: Negative for dysuria and frequency.  Musculoskeletal: Negative for arthralgias and back pain.  Skin: Negative for rash.  Neurological: Negative for dizziness, weakness and headaches.     Vitals BP 122/64   Pulse 84   Temp (!) 96.9 F (36.1 C)   Ht 5\' 5"  (1.651 m)   Wt 197 lb 9.6 oz (89.6 kg)   SpO2 99%   BMI 32.88 kg/m   Objective:   Physical Exam Vitals and nursing note reviewed.   Constitutional:      Appearance: Normal appearance.  HENT:     Head: Normocephalic and atraumatic.     Nose: Nose normal.     Mouth/Throat:     Mouth: Mucous membranes are moist.     Pharynx: Oropharynx is clear.  Eyes:     Extraocular Movements: Extraocular movements intact.     Conjunctiva/sclera: Conjunctivae normal.     Pupils: Pupils are equal, round, and reactive to light.  Cardiovascular:     Rate and Rhythm: Normal rate and regular rhythm.     Pulses: Normal pulses.     Heart sounds: Normal heart sounds.  Pulmonary:     Effort: Pulmonary effort is normal.     Breath sounds: Normal breath sounds. No wheezing, rhonchi or rales.  Musculoskeletal:        General: Normal range of motion.     Right lower leg: No edema.     Left lower leg: No edema.  Skin:    General: Skin is warm and dry.     Findings: No lesion or rash.  Neurological:     General: No focal deficit present.     Mental Status: She is alert and oriented to person, place, and time.  Psychiatric:        Mood and Affect: Mood normal.        Behavior: Behavior normal.      Assessment and Plan  1. Psoriasis  2. Irritable bowel syndrome with both constipation and diarrhea  3. Hyperlipidemia, unspecified hyperlipidemia type   Not currently on any medications. Has some menopausal vasomotor symptoms, however, pt declining to try SSRI or HRT.   IBS- stable. No new issues.  H/o psoriasis- cont f/u with Dr. Margo Aye prn.  HLD- eat low fat/cholesterol diet.  Inc in exercising.  Declining flu vaccine.  F/u prn.  F/u prn.

## 2021-07-17 ENCOUNTER — Ambulatory Visit (INDEPENDENT_AMBULATORY_CARE_PROVIDER_SITE_OTHER): Payer: BC Managed Care – PPO | Admitting: Family Medicine

## 2021-07-17 ENCOUNTER — Other Ambulatory Visit: Payer: Self-pay

## 2021-07-17 VITALS — BP 129/84 | HR 89 | Ht 64.0 in | Wt 197.2 lb

## 2021-07-17 DIAGNOSIS — Z0001 Encounter for general adult medical examination with abnormal findings: Secondary | ICD-10-CM

## 2021-07-17 DIAGNOSIS — Z131 Encounter for screening for diabetes mellitus: Secondary | ICD-10-CM

## 2021-07-17 DIAGNOSIS — Z0000 Encounter for general adult medical examination without abnormal findings: Secondary | ICD-10-CM

## 2021-07-17 DIAGNOSIS — L659 Nonscarring hair loss, unspecified: Secondary | ICD-10-CM | POA: Diagnosis not present

## 2021-07-17 NOTE — Progress Notes (Signed)
Patient ID: Breanna Mcguire, female    DOB: [birth year], 45 y.o.   MRN: [id number]   Chief Complaint  Patient presents with   Annual Exam   Subjective:    HPI The patient comes in today for a wellness visit. Has 2 new dogs and having dec sleep.  Rescued 2 dogs.   A review of their health history was completed.  A review of medications was also completed.  Any needed refills; no  Eating habits: eating good  Falls/  MVA accidents in past few months: no  Regular exercise: walking dogs  Specialist pt sees on regular basis: no  Preventative health issues were discussed.   Additional concerns: needs form completed at work and continued right foot pain   Rt lateral foot pain- not constatnt pain,  Slides for summer it can hurt it. Walking for hours hurts.  Gyn for [facility] for well woman exam. LMP- 07/16/21. Might be starting perimenopausal.  Pt is a smoker and over 30 yrs.   Medical History Breanna Mcguire has a past medical history of Plaque psoriasis.   No outpatient encounter medications on file as of 07/17/2021.   No facility-administered encounter medications on file as of 07/17/2021.     Review of Systems  Constitutional:  Negative for chills and fever.  HENT:  Negative for congestion, rhinorrhea and sore throat.   Respiratory:  Negative for cough, shortness of breath and wheezing.   Cardiovascular:  Negative for chest pain and leg swelling.  Gastrointestinal:  Negative for abdominal pain, diarrhea, nausea and vomiting.  Genitourinary:  Negative for dysuria and frequency.  Musculoskeletal:  Positive for arthralgias (rt foot pain, intermittent). Negative for back pain.  Skin:  Negative for rash.  Neurological:  Negative for dizziness, weakness and headaches.    Vitals BP 129/84   Pulse 89   Ht 5' 4"  (1.626 m)   Wt 197 lb 3.2 oz (89.4 kg)   BMI 33.85 kg/m   Objective:   Physical Exam Vitals and nursing note reviewed.  Constitutional:       Appearance: Normal appearance.  HENT:     Head: Normocephalic and atraumatic.     Nose: Nose normal.     Mouth/Throat:     Mouth: Mucous membranes are moist.     Pharynx: Oropharynx is clear.  Eyes:     Extraocular Movements: Extraocular movements intact.     Conjunctiva/sclera: Conjunctivae normal.     Pupils: Pupils are equal, round, and reactive to light.  Cardiovascular:     Rate and Rhythm: Normal rate and regular rhythm.     Pulses: Normal pulses.     Heart sounds: Normal heart sounds.  Pulmonary:     Effort: Pulmonary effort is normal.     Breath sounds: Normal breath sounds. No wheezing, rhonchi or rales.  Musculoskeletal:        General: Normal range of motion.     Right lower leg: No edema.     Left lower leg: No edema.  Skin:    General: Skin is warm and dry.     Findings: No lesion or rash.  Neurological:     General: No focal deficit present.     Mental Status: She is alert and oriented to person, place, and time.  Psychiatric:        Mood and Affect: Mood normal.        Behavior: Behavior normal.     Assessment and Plan   1. Wellness examination  2. Laboratory tests ordered as part of a complete physical exam (CPE) - CBC; Future - CMP14+EGFR; Future - Lipid panel; Future - TSH; Future - TSH - Lipid panel - CMP14+EGFR - CBC  3. Hair loss - TSH; Future - TSH   Elevated cholesterol 12/21. Not on meds.  Will order labs.   -hair loss - will order tsh and cbc.  HM-seeing gyn for well woman exam.  Pt to get vaccines at pharmacy. Pt declining colonoscopy order.  Return in about 6 months (around 01/14/2022), or if symptoms worsen or fail to improve.

## 2021-07-18 DIAGNOSIS — Z0000 Encounter for general adult medical examination without abnormal findings: Secondary | ICD-10-CM | POA: Diagnosis not present

## 2021-07-18 DIAGNOSIS — L659 Nonscarring hair loss, unspecified: Secondary | ICD-10-CM | POA: Diagnosis not present

## 2021-07-19 LAB — CMP14+EGFR
ALT: 9 IU/L (ref 0–32)
AST: 12 IU/L (ref 0–40)
Albumin/Globulin Ratio: 1.5 (ref 1.2–2.2)
Albumin: 4.1 g/dL (ref 3.8–4.8)
Alkaline Phosphatase: 122 IU/L — ABNORMAL HIGH (ref 44–121)
BUN/Creatinine Ratio: 9 (ref 9–23)
BUN: 8 mg/dL (ref 6–24)
Bilirubin Total: 0.3 mg/dL (ref 0.0–1.2)
CO2: 22 mmol/L (ref 20–29)
Calcium: 9.6 mg/dL (ref 8.7–10.2)
Chloride: 101 mmol/L (ref 96–106)
Creatinine, Ser: 0.85 mg/dL (ref 0.57–1.00)
Globulin, Total: 2.7 g/dL (ref 1.5–4.5)
Glucose: 94 mg/dL (ref 65–99)
Potassium: 4.3 mmol/L (ref 3.5–5.2)
Sodium: 140 mmol/L (ref 134–144)
Total Protein: 6.8 g/dL (ref 6.0–8.5)
eGFR: 85 mL/min/{1.73_m2} (ref >59)

## 2021-07-19 LAB — LIPID PANEL
Chol/HDL Ratio: 5 ratio — ABNORMAL HIGH (ref 0.0–4.4)
Cholesterol, Total: 241 mg/dL — ABNORMAL HIGH (ref 100–199)
HDL: 48 mg/dL (ref >39)
LDL Chol Calc (NIH): 169 mg/dL — ABNORMAL HIGH (ref 0–99)
Triglycerides: 134 mg/dL (ref 0–149)
VLDL Cholesterol Cal: 24 mg/dL (ref 5–40)

## 2021-07-19 LAB — CBC
Hematocrit: 42.4 % (ref 34.0–46.6)
Hemoglobin: 14.3 g/dL (ref 11.1–15.9)
MCH: 28.9 pg (ref 26.6–33.0)
MCHC: 33.7 g/dL (ref 31.5–35.7)
MCV: 86 fL (ref 79–97)
Platelets: 285 10*3/uL (ref 150–450)
RBC: 4.95 x10E6/uL (ref 3.77–5.28)
RDW: 13.3 % (ref 11.7–15.4)
WBC: 10.7 10*3/uL (ref 3.4–10.8)

## 2021-07-19 LAB — TSH: TSH: 2.32 u[IU]/mL (ref 0.450–4.500)

## 2021-07-26 ENCOUNTER — Encounter: Payer: Self-pay | Admitting: Family Medicine

## 2021-07-26 DIAGNOSIS — E785 Hyperlipidemia, unspecified: Secondary | ICD-10-CM | POA: Insufficient documentation

## 2022-03-04 ENCOUNTER — Other Ambulatory Visit: Payer: BC Managed Care – PPO | Admitting: Obstetrics & Gynecology

## 2022-03-26 ENCOUNTER — Encounter: Payer: Self-pay | Admitting: Obstetrics & Gynecology

## 2022-03-26 ENCOUNTER — Ambulatory Visit (INDEPENDENT_AMBULATORY_CARE_PROVIDER_SITE_OTHER): Payer: BC Managed Care – PPO | Admitting: Obstetrics & Gynecology

## 2022-03-26 ENCOUNTER — Other Ambulatory Visit (HOSPITAL_COMMUNITY)
Admission: RE | Admit: 2022-03-26 | Discharge: 2022-03-26 | Disposition: A | Discharge status: Home / Self Care | Payer: BC Managed Care – PPO | Source: Ambulatory Visit | Attending: Obstetrics & Gynecology | Admitting: Obstetrics & Gynecology

## 2022-03-26 VITALS — BP 135/84 | HR 85 | Ht 65.0 in | Wt 197.4 lb

## 2022-03-26 DIAGNOSIS — Z1231 Encounter for screening mammogram for malignant neoplasm of breast: Secondary | ICD-10-CM

## 2022-03-26 DIAGNOSIS — Z01419 Encounter for gynecological examination (general) (routine) without abnormal findings: Secondary | ICD-10-CM

## 2022-03-26 NOTE — Progress Notes (Signed)
? ?WELL-WOMAN EXAMINATION ?Patient name: Breanna Mcguire MRN [id number]  Date of birth: 09-05-1976 ?Chief Complaint:   ?Gynecologic Exam, Annual Exam, and New Patient (Initial Visit) ? ?History of Present Illness:   ?Breanna Mcguire is a 46 y.o. [phone]  female being seen today for a routine well-woman exam.  ?Today she notes that menses are every month- lasting about 4 days.  Changing pad twice per day. Denies HMB or dysmenorrhea.  Denies intermenstrual bleeding. ? ?Some hot flashes, but tolerable.  Taking OTC supplements ? ?Patient's last menstrual period was 03/01/2022 (exact date). ?Denies issues with her menses ?The current method of family planning is tubal ligation.  ? ? ?Last pap due today.  ?Last mammogram: 2021, plans to scheduled. ?Last colonoscopy: discussed guideline change to 48yo, plans to follow up with PCP ? ? ?  03/26/2022  ?  9:55 AM 07/17/2021  ?  1:43 PM 10/31/2020  ?  2:32 PM 03/21/2020  ?  1:48 PM 11/24/2018  ? 10:34 AM  ?Depression screen PHQ 2/9  ?Decreased Interest 0 0 0 0 0  ?Down, Depressed, Hopeless 0 0 0 0 0  ?PHQ - 2 Score 0 0 0 0 0  ?Altered sleeping 2      ?Tired, decreased energy 1      ?Change in appetite 0      ?Feeling bad or failure about yourself  0      ?Trouble concentrating 0      ?Moving slowly or fidgety/restless 0      ?Suicidal thoughts 0      ?PHQ-9 Score 3      ? ? ? ? ?Review of Systems:   ?Pertinent items are noted in HPI ?Denies any headaches, blurred vision, fatigue, shortness of breath, chest pain, abdominal pain, bowel movements, urination, or intercourse unless otherwise stated above. ? ?Pertinent History Reviewed:  ?Reviewed past medical,surgical, social and family history.  ?Reviewed problem list, medications and allergies. ?Physical Assessment:  ? ?Vitals:  ? 03/26/22 0952  ?BP: 135/84  ?Pulse: 85  ?Weight: 197 lb 6.4 oz (89.5 kg)  ?Height: 5\' 5"  (1.651 m)  ?Body mass index is 32.85 kg/m?. ?  ?     Physical Examination:  ? General appearance - well appearing, and  in no distress ? Mental status - alert, oriented to person, place, and time ? Psych:  She has a normal mood and affect ? Skin - warm and dry, normal color, no suspicious lesions noted ? Chest - effort normal, all lung fields clear to auscultation bilaterally ? Heart - normal rate and regular rhythm ? Neck:  midline trachea, no thyromegaly or nodules ? Breasts - breasts appear normal, no suspicious masses, no skin or nipple changes or  axillary nodes ? Abdomen - soft, nontender, nondistended, no masses or organomegaly ? Pelvic - VULVA: normal appearing vulva with no masses, tenderness or lesions  VAGINA: normal appearing vagina with normal color and discharge, no lesions  CERVIX: normal appearing cervix without discharge or lesions, no CMT ? Thin prep pap is done with HR HPV cotesting ? UTERUS: uterus is felt to be normal size, shape, consistency and nontender  ? ADNEXA: No adnexal masses or tenderness noted. ? Extremities:  No swelling or varicosities noted ? ?Chaperone: Marcelino Scot   ? ? ?Assessment & Plan:  ?1) Well-Woman Exam ?-pap collected, reviewed ASCCP guidelines ?-mammogram ordered ? ?Orders Placed This Encounter  ?Procedures  ? MM 3D SCREEN BREAST BILATERAL  ? ? ?Meds: No orders  of the defined types were placed in this encounter. ? ? ?Follow-up: Return in about 1 year (around 03/27/2023) for Annual. ? ? ?Janyth Pupa, DO ?Attending [facility] ? ? ?

## 2022-03-28 LAB — CYTOLOGY - PAP
Comment: NEGATIVE
Diagnosis: NEGATIVE
High risk HPV: NEGATIVE

## 2022-05-01 ENCOUNTER — Encounter (HOSPITAL_COMMUNITY): Payer: Self-pay

## 2022-05-01 ENCOUNTER — Ambulatory Visit (HOSPITAL_COMMUNITY): Payer: BC Managed Care – PPO

## 2022-05-09 ENCOUNTER — Ambulatory Visit (HOSPITAL_COMMUNITY)
Admission: RE | Admit: 2022-05-09 | Discharge: 2022-05-09 | Disposition: A | Discharge status: Home / Self Care | Payer: BC Managed Care – PPO | Source: Ambulatory Visit | Attending: Obstetrics & Gynecology | Admitting: Obstetrics & Gynecology

## 2022-05-09 ENCOUNTER — Encounter (HOSPITAL_COMMUNITY): Payer: Self-pay | Admitting: Radiology

## 2022-05-09 DIAGNOSIS — Z1231 Encounter for screening mammogram for malignant neoplasm of breast: Secondary | ICD-10-CM | POA: Insufficient documentation

## 2022-05-12 ENCOUNTER — Other Ambulatory Visit (HOSPITAL_COMMUNITY): Payer: Self-pay | Admitting: Obstetrics & Gynecology

## 2022-05-12 DIAGNOSIS — R928 Other abnormal and inconclusive findings on diagnostic imaging of breast: Secondary | ICD-10-CM

## 2022-05-29 ENCOUNTER — Ambulatory Visit (HOSPITAL_COMMUNITY)
Admission: RE | Admit: 2022-05-29 | Discharge: 2022-05-29 | Disposition: A | Discharge status: Home / Self Care | Payer: BC Managed Care – PPO | Source: Ambulatory Visit | Attending: Obstetrics & Gynecology | Admitting: Obstetrics & Gynecology

## 2022-05-29 DIAGNOSIS — R922 Inconclusive mammogram: Secondary | ICD-10-CM | POA: Diagnosis not present

## 2022-05-29 DIAGNOSIS — R928 Other abnormal and inconclusive findings on diagnostic imaging of breast: Secondary | ICD-10-CM | POA: Diagnosis not present

## 2022-05-29 DIAGNOSIS — N6042 Mammary duct ectasia of left breast: Secondary | ICD-10-CM | POA: Diagnosis not present

## 2022-06-05 IMAGING — MG DIGITAL SCREENING BILAT W/ TOMO W/ CAD
8 series · 8 of 24 positions shown · non-contrast
Comparison: None.

CLINICAL DATA: Screening. This is the patient's initial baseline
mammogram.

EXAM:
DIGITAL SCREENING BILATERAL MAMMOGRAM WITH TOMO AND CAD

[R CC synth-2D]
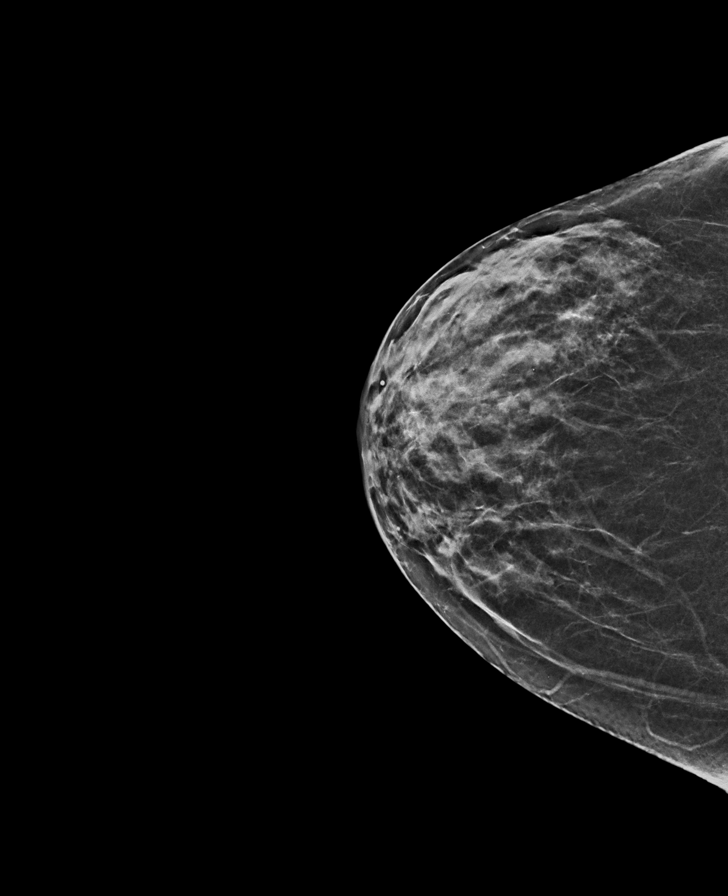

[L CC synth-2D]
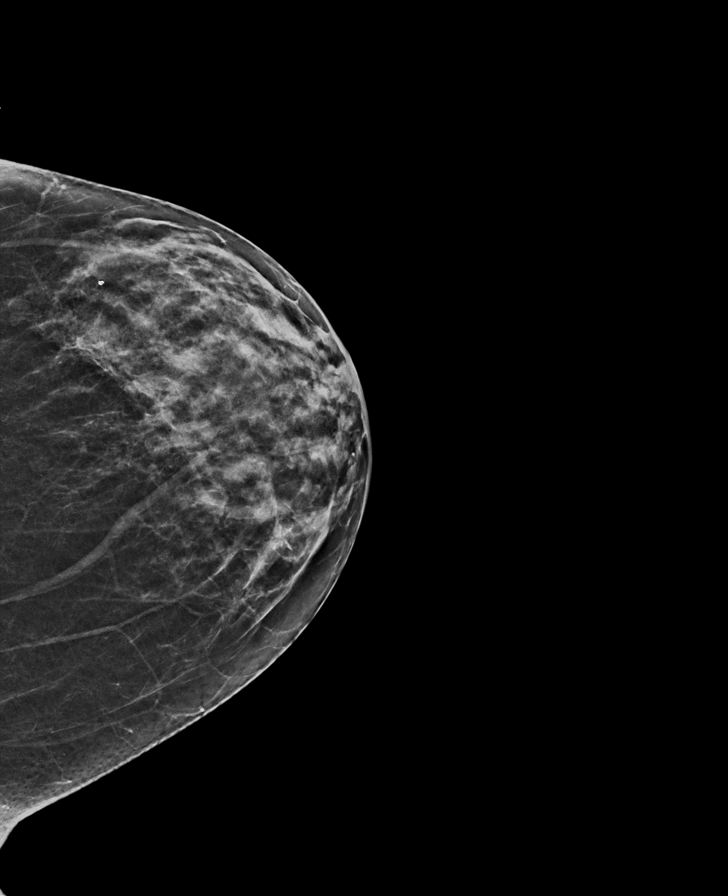

[R MLO synth-2D]
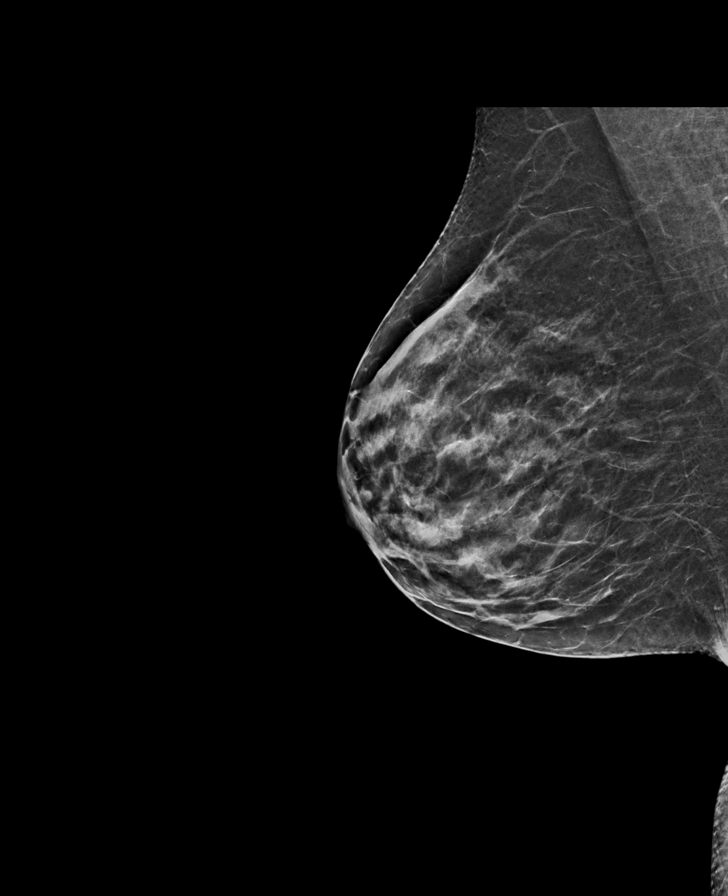

[L MLO synth-2D]
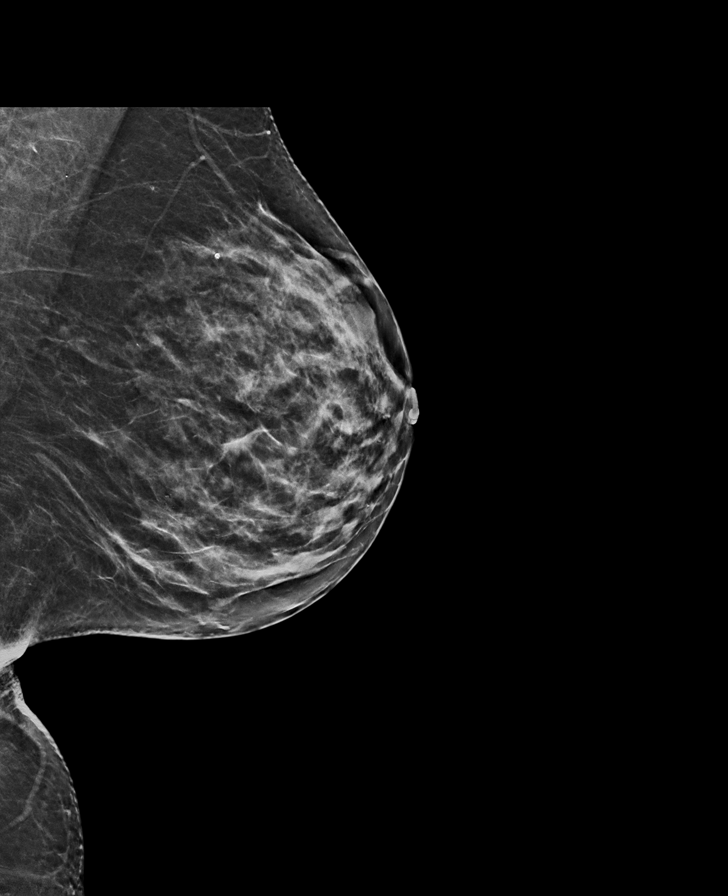

[R CC tomo · tomo slice 27/54.0]
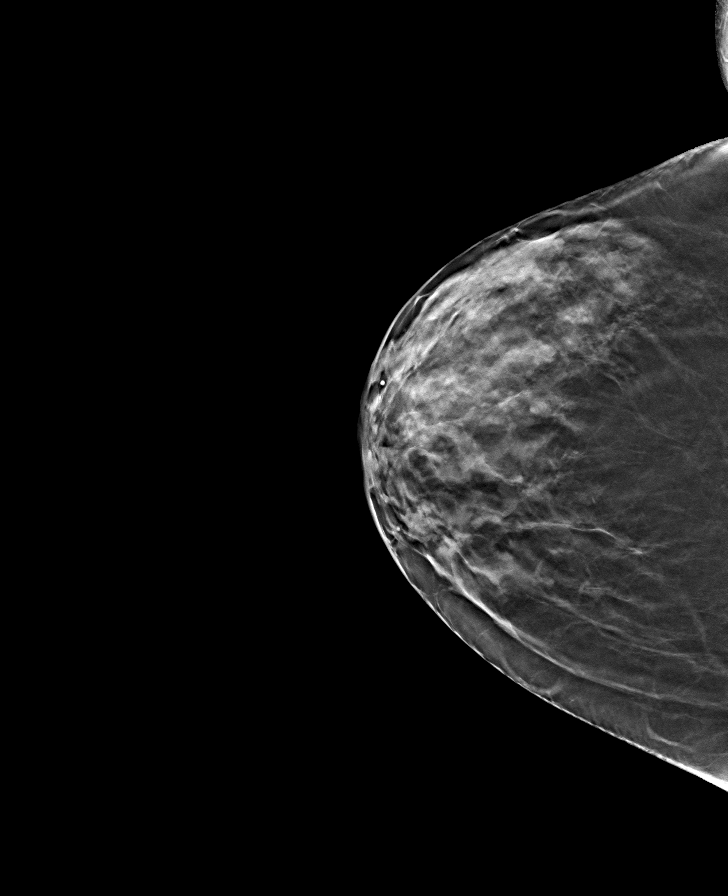

[R MLO tomo · tomo slice 30/59.0]
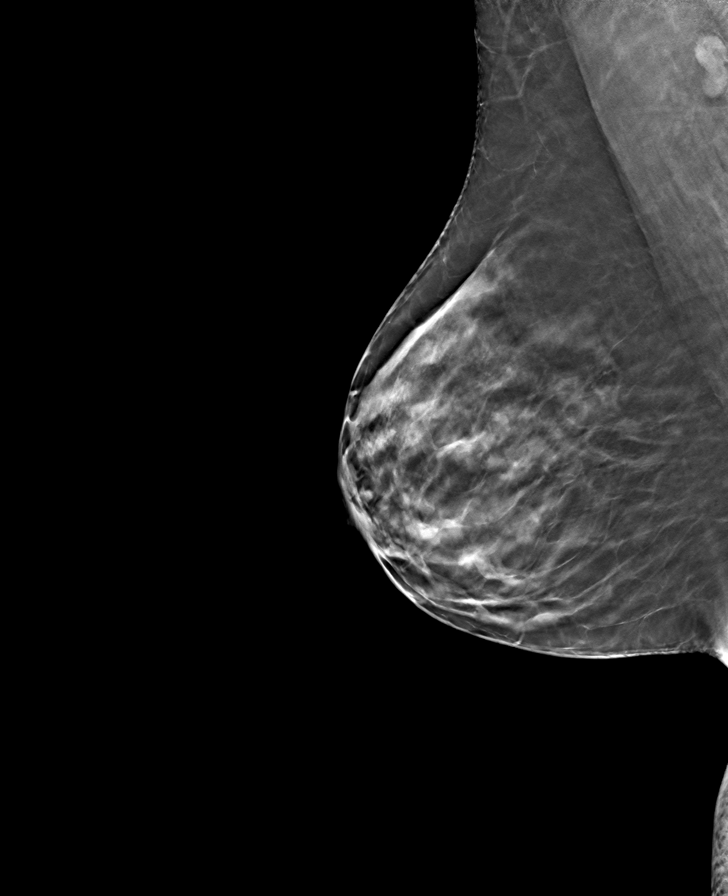

[L MLO tomo · tomo slice 33/64.0]
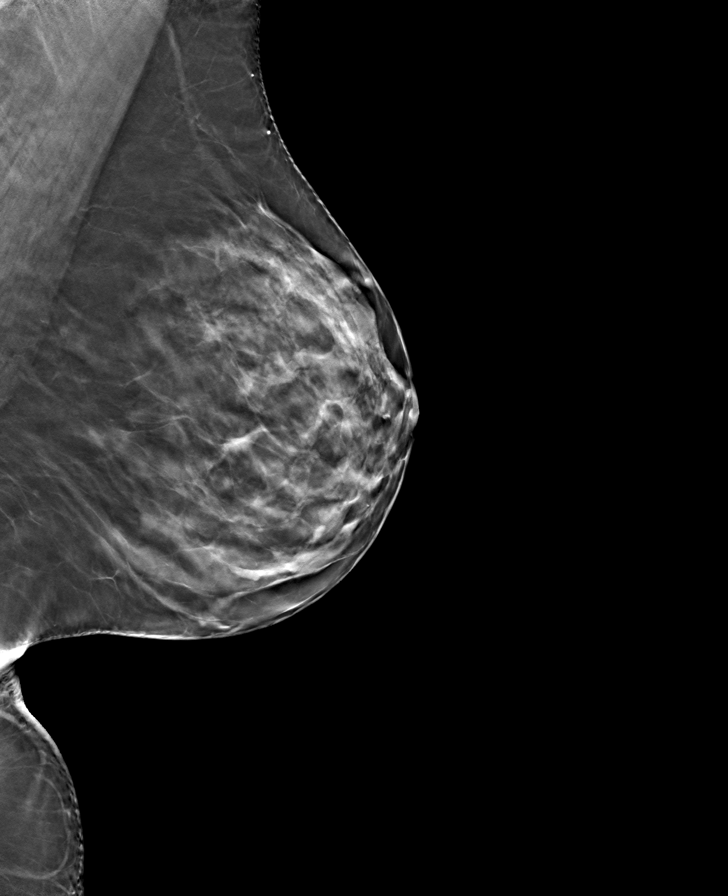

[L CC tomo · tomo slice 29/57.0]
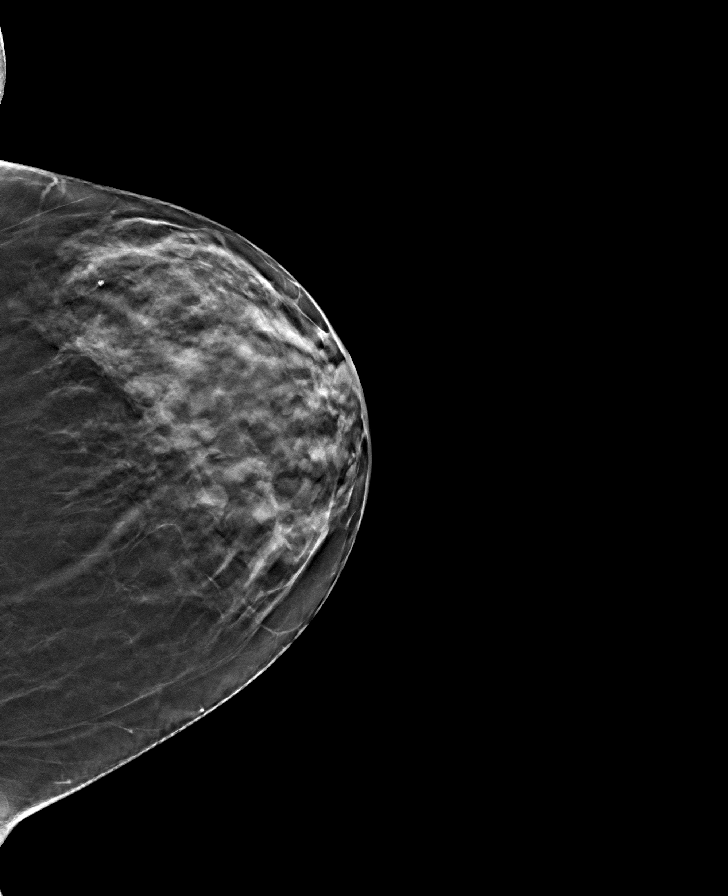

[8 of 24 positions shown; findings below may reference images not displayed]

ACR Breast Density Category c: The breast tissue is heterogeneously
dense, which may obscure small masses.
FINDINGS: In the right breast, possible distortion warrants further
evaluation. In the left breast, no findings suspicious for
malignancy. Images were processed with CAD.
IMPRESSION: Further evaluation is suggested for possible distortion in the right
breast.

RECOMMENDATION:
Diagnostic mammogram and possibly ultrasound of the right breast.
(Code:[id number]-G-AAI)

The patient will be contacted regarding the findings, and additional
imaging will be scheduled.

BI-RADS CATEGORY  0: Incomplete. Need additional imaging evaluation
and/or prior mammograms for comparison.

## 2022-07-21 ENCOUNTER — Telehealth: Payer: Self-pay | Admitting: Family Medicine

## 2022-07-21 DIAGNOSIS — Z0000 Encounter for general adult medical examination without abnormal findings: Secondary | ICD-10-CM

## 2022-07-21 DIAGNOSIS — Z79899 Other long term (current) drug therapy: Secondary | ICD-10-CM

## 2022-07-21 DIAGNOSIS — E785 Hyperlipidemia, unspecified: Secondary | ICD-10-CM

## 2022-07-21 NOTE — Telephone Encounter (Signed)
Patient has a physical coming up on 07/29/22 will need blood work to complete for form for work.  CB# [phone]

## 2022-07-22 ENCOUNTER — Encounter: Payer: BC Managed Care – PPO | Admitting: Family Medicine

## 2022-07-22 NOTE — Telephone Encounter (Signed)
Last labs completed 07/18/21-CBC,CMP14+EGFR,LIPID,TSH. Please advise. Thank you

## 2022-07-22 NOTE — Telephone Encounter (Signed)
Lab orders placed and pt is aware 

## 2022-07-23 DIAGNOSIS — E785 Hyperlipidemia, unspecified: Secondary | ICD-10-CM | POA: Diagnosis not present

## 2022-07-23 DIAGNOSIS — Z79899 Other long term (current) drug therapy: Secondary | ICD-10-CM | POA: Diagnosis not present

## 2022-07-23 DIAGNOSIS — Z0000 Encounter for general adult medical examination without abnormal findings: Secondary | ICD-10-CM | POA: Diagnosis not present

## 2022-07-24 LAB — CBC WITH DIFFERENTIAL/PLATELET
Basophils Absolute: 0.1 10*3/uL (ref 0.0–0.2)
Basos: 1 %
EOS (ABSOLUTE): 0.2 10*3/uL (ref 0.0–0.4)
Eos: 2 %
Hematocrit: 45.4 % (ref 34.0–46.6)
Hemoglobin: 14.6 g/dL (ref 11.1–15.9)
Immature Grans (Abs): 0.1 10*3/uL (ref 0.0–0.1)
Immature Granulocytes: 1 %
Lymphocytes Absolute: 2.7 10*3/uL (ref 0.7–3.1)
Lymphs: 27 %
MCH: 27.8 pg (ref 26.6–33.0)
MCHC: 32.2 g/dL (ref 31.5–35.7)
MCV: 87 fL (ref 79–97)
Monocytes Absolute: 0.5 10*3/uL (ref 0.1–0.9)
Monocytes: 5 %
Neutrophils Absolute: 6.7 10*3/uL (ref 1.4–7.0)
Neutrophils: 64 %
Platelets: 276 10*3/uL (ref 150–450)
RBC: 5.25 x10E6/uL (ref 3.77–5.28)
RDW: 13.5 % (ref 11.7–15.4)
WBC: 10.3 10*3/uL (ref 3.4–10.8)

## 2022-07-24 LAB — CMP14+EGFR
ALT: 15 IU/L (ref 0–32)
AST: 15 IU/L (ref 0–40)
Albumin/Globulin Ratio: 1.5 (ref 1.2–2.2)
Albumin: 4.3 g/dL (ref 3.9–4.9)
Alkaline Phosphatase: 122 IU/L — ABNORMAL HIGH (ref 44–121)
BUN/Creatinine Ratio: 10 (ref 9–23)
BUN: 9 mg/dL (ref 6–24)
Bilirubin Total: 0.3 mg/dL (ref 0.0–1.2)
CO2: 23 mmol/L (ref 20–29)
Calcium: 9.7 mg/dL (ref 8.7–10.2)
Chloride: 103 mmol/L (ref 96–106)
Creatinine, Ser: 0.89 mg/dL (ref 0.57–1.00)
Globulin, Total: 2.8 g/dL (ref 1.5–4.5)
Glucose: 93 mg/dL (ref 70–99)
Potassium: 3.8 mmol/L (ref 3.5–5.2)
Sodium: 140 mmol/L (ref 134–144)
Total Protein: 7.1 g/dL (ref 6.0–8.5)
eGFR: 80 mL/min/{1.73_m2} (ref >59)

## 2022-07-24 LAB — LIPID PANEL
Chol/HDL Ratio: 4.9 ratio — ABNORMAL HIGH (ref 0.0–4.4)
Cholesterol, Total: 239 mg/dL — ABNORMAL HIGH (ref 100–199)
HDL: 49 mg/dL (ref >39)
LDL Chol Calc (NIH): 168 mg/dL — ABNORMAL HIGH (ref 0–99)
Triglycerides: 122 mg/dL (ref 0–149)
VLDL Cholesterol Cal: 22 mg/dL (ref 5–40)

## 2022-07-29 ENCOUNTER — Ambulatory Visit (INDEPENDENT_AMBULATORY_CARE_PROVIDER_SITE_OTHER): Payer: BC Managed Care – PPO | Admitting: Family Medicine

## 2022-07-29 VITALS — BP 134/88 | HR 74 | Temp 97.5°F | Ht 65.0 in | Wt 193.0 lb

## 2022-07-29 DIAGNOSIS — Z1211 Encounter for screening for malignant neoplasm of colon: Secondary | ICD-10-CM

## 2022-07-29 DIAGNOSIS — Z0000 Encounter for general adult medical examination without abnormal findings: Secondary | ICD-10-CM

## 2022-07-29 DIAGNOSIS — K047 Periapical abscess without sinus: Secondary | ICD-10-CM | POA: Diagnosis not present

## 2022-07-29 MED ORDER — AMOXICILLIN-POT CLAVULANATE 875-125 MG PO TABS: 1.0000 | ORAL_TABLET | Freq: Two times a day (BID) | ORAL | 0 refills | Status: DC

## 2022-07-29 NOTE — Patient Instructions (Signed)
Antibiotic as prescribed.   Cologuard ordered.  Follow up annually.  If you worsen, please go to the ER.  Take care  Dr. Adriana Simas

## 2022-07-30 DIAGNOSIS — Z0000 Encounter for general adult medical examination without abnormal findings: Secondary | ICD-10-CM | POA: Insufficient documentation

## 2022-07-30 DIAGNOSIS — K047 Periapical abscess without sinus: Secondary | ICD-10-CM | POA: Insufficient documentation

## 2022-07-30 DIAGNOSIS — K089 Disorder of teeth and supporting structures, unspecified: Secondary | ICD-10-CM | POA: Insufficient documentation

## 2022-07-30 NOTE — Assessment & Plan Note (Signed)
Labs reviewed with patient.  Recommended dietary changes regarding hyperlipidemia. Cologuard ordered. Mammogram and Pap smear up-to-date.

## 2022-07-30 NOTE — Progress Notes (Signed)
Subjective:  Patient ID: Breanna Mcguire, female    DOB: Aug 26, 1976  Age: 46 y.o. MRN: [id number]  CC: Chief Complaint  Patient presents with   Establish Care   Annual Exam    No pap , form completion, Sinus problem , facial swelling right side - hx of same in the past     HPI:  46 year old female presents for an annual exam.  Patient has had recent labs.  We will review these with the patient today.  Patient has never had colon cancer screening.  Recommended colonoscopy.  Patient does not wish to proceed.  She is amendable to Cologuard.  Pap smear is up-to-date.  Mammogram up-to-date.  Additionally, patient reports that yesterday she developed right-sided facial swelling and associated discomfort.  She is concerned that this is related to her sinuses.  She denies any dental pain.  However, she has very poor dentition.  No fever.  Patient Active Problem List   Diagnosis Date Noted   Dental infection 07/30/2022   Annual physical exam 07/30/2022   Hyperlipidemia 07/26/2021   Vasomotor symptoms due to menopause 02/17/2019   Dyshidrotic eczema 03/16/2017   Gastroesophageal reflux disease without esophagitis 12/10/2015   Irritable bowel syndrome with both constipation and diarrhea 12/10/2015    Social Hx   Social History   Socioeconomic History   Marital status: Married    Spouse name: Not on file   Number of children: Not on file   Years of education: Not on file   Highest education level: Not on file  Occupational History   Not on file  Tobacco Use   Smoking status: Every Day    Packs/day: 0.50    Years: 25.00    Total pack years: 12.50    Types: Cigarettes   Smokeless tobacco: Never  Vaping Use   Vaping Use: Never used  Substance and Sexual Activity   Alcohol use: Yes    Alcohol/week: 0.0 standard drinks of alcohol    Comment: occ.   Drug use: No   Sexual activity: Yes    Birth control/protection: Surgical    Comment: tubal  Other Topics Concern   Not on  file  Social History Narrative   Not on file   Social Determinants of Health   Financial Resource Strain: Low Risk  (03/26/2022)   Overall Financial Resource Strain (CARDIA)    Difficulty of Paying Living Expenses: Not hard at all  Food Insecurity: No Food Insecurity (03/26/2022)   Hunger Vital Sign    Worried About Running Out of Food in the Last Year: Never true    Ran Out of Food in the Last Year: Never true  Transportation Needs: No Transportation Needs (03/26/2022)   PRAPARE - Administrator, Civil Service (Medical): No    Lack of Transportation (Non-Medical): No  Physical Activity: Insufficiently Active (03/26/2022)   Exercise Vital Sign    Days of Exercise per Week: 1 day    Minutes of Exercise per Session: 20 min  Stress: No Stress Concern Present (03/26/2022)   [employer] of Occupational Health - Occupational Stress Questionnaire    Feeling of Stress : Not at all  Social Connections: Moderately Isolated (03/26/2022)   Social Connection and Isolation Panel [NHANES]    Frequency of Communication with Friends and Family: Twice a week    Frequency of Social Gatherings with Friends and Family: Once a week    Attends Religious Services: Never    Database administrator or  Organizations: No    Attends Engineer, structural: Never    Marital Status: Married    Review of Systems Per HPI  Objective:  BP 134/88   Pulse 74   Temp (!) 97.5 F (36.4 C)   Ht 5\' 5"  (1.651 m)   Wt 193 lb (87.5 kg)   SpO2 100%   BMI 32.12 kg/m      07/29/2022    9:09 AM 03/26/2022    9:52 AM 07/17/2021    1:35 PM  BP/Weight  Systolic BP 134 135 129  Diastolic BP 88 84 84  Wt. (Lbs) 193 197.4 197.2  BMI 32.12 kg/m2 32.85 kg/m2 33.85 kg/m2    Physical Exam Vitals and nursing note reviewed.  Constitutional:      Appearance: She is obese.  HENT:     Head: Normocephalic and atraumatic.      Comments: Right-sided facial swelling with tenderness to palpation.     Right Ear: Tympanic membrane normal.     Left Ear: Tympanic membrane normal.     Mouth/Throat:     Comments: Patient has very poor dentition with decay. Eyes:     General:        Right eye: No discharge.        Left eye: No discharge.     Conjunctiva/sclera: Conjunctivae normal.  Cardiovascular:     Rate and Rhythm: Normal rate and regular rhythm.  Pulmonary:     Effort: Pulmonary effort is normal.     Breath sounds: Normal breath sounds. No wheezing, rhonchi or rales.  Abdominal:     General: There is no distension.     Palpations: Abdomen is soft.     Tenderness: There is no abdominal tenderness.  Neurological:     Mental Status: She is alert.  Psychiatric:        Mood and Affect: Mood normal.        Behavior: Behavior normal.     Lab Results  Component Value Date   WBC 10.3 07/23/2022   HGB 14.6 07/23/2022   HCT 45.4 07/23/2022   PLT 276 07/23/2022   GLUCOSE 93 07/23/2022   CHOL 239 (H) 07/23/2022   TRIG 122 07/23/2022   HDL 49 07/23/2022   LDLCALC 168 (H) 07/23/2022   ALT 15 07/23/2022   AST 15 07/23/2022   NA 140 07/23/2022   K 3.8 07/23/2022   CL 103 07/23/2022   CREATININE 0.89 07/23/2022   BUN 9 07/23/2022   CO2 23 07/23/2022   TSH 2.320 07/18/2021     Assessment & Plan:   Problem List Items Addressed This Visit       Digestive   Dental infection    Patient's exam findings are consistent with dental infection.  Treating with Augmentin.  Advised patient that if she fails to improve or worsens, she needs to go directly to the hospital.        Other   Annual physical exam    Labs reviewed with patient.  Recommended dietary changes regarding hyperlipidemia. Cologuard ordered. Mammogram and Pap smear up-to-date.      Other Visit Diagnoses     Colon cancer screening    -  Primary   Relevant Orders   Cologuard       Meds ordered this encounter  Medications   amoxicillin-clavulanate (AUGMENTIN) 875-125 MG tablet    Sig: Take 1 tablet by  mouth 2 (two) times daily.    Dispense:  20 tablet    Refill:  0    Follow-up:  Return in about 1 year (around 07/30/2023).  Everlene Other DO [facility]

## 2022-07-30 NOTE — Assessment & Plan Note (Signed)
Patient's exam findings are consistent with dental infection.  Treating with Augmentin.  Advised patient that if she fails to improve or worsens, she needs to go directly to the hospital.

## 2023-08-20 ENCOUNTER — Ambulatory Visit (INDEPENDENT_AMBULATORY_CARE_PROVIDER_SITE_OTHER): Payer: BC Managed Care – PPO | Admitting: Nurse Practitioner

## 2023-08-20 VITALS — BP 108/66 | HR 82 | Temp 98.0°F | Ht 64.17 in | Wt 193.4 lb

## 2023-08-20 DIAGNOSIS — E785 Hyperlipidemia, unspecified: Secondary | ICD-10-CM | POA: Diagnosis not present

## 2023-08-20 DIAGNOSIS — K089 Disorder of teeth and supporting structures, unspecified: Secondary | ICD-10-CM

## 2023-08-20 DIAGNOSIS — Z1231 Encounter for screening mammogram for malignant neoplasm of breast: Secondary | ICD-10-CM

## 2023-08-20 DIAGNOSIS — Z1239 Encounter for other screening for malignant neoplasm of breast: Secondary | ICD-10-CM

## 2023-08-20 DIAGNOSIS — Z1159 Encounter for screening for other viral diseases: Secondary | ICD-10-CM

## 2023-08-20 DIAGNOSIS — Z0000 Encounter for general adult medical examination without abnormal findings: Secondary | ICD-10-CM

## 2023-08-20 DIAGNOSIS — Z0001 Encounter for general adult medical examination with abnormal findings: Secondary | ICD-10-CM

## 2023-08-20 DIAGNOSIS — Z130 Encounter for screening for diseases of the blood and blood-forming organs and certain disorders involving the immune mechanism: Secondary | ICD-10-CM

## 2023-08-20 DIAGNOSIS — Z13228 Encounter for screening for other metabolic disorders: Secondary | ICD-10-CM

## 2023-08-20 DIAGNOSIS — Z1329 Encounter for screening for other suspected endocrine disorder: Secondary | ICD-10-CM

## 2023-08-20 NOTE — Progress Notes (Signed)
Subjective:    Patient ID: Breanna Mcguire, female    DOB: 1976-10-11, 47 y.o.   MRN: [id number]  HPI The patient comes in today for a wellness visit.  This is her annual physical for her job.    A review of their health history was completed.  A review of medications was also completed.  Any needed refills; No  Eating habits: fair  Falls/  MVA accidents in past few months: No  Regular exercise: Yes  Specialist pt sees on regular basis: No  Preventative health issues were discussed.   Has Cologuard kit at home, does not want to complete at this time.  Will reconsider colon cancer screening next year.  Is due for her mammogram.  Has an eye exam about every 2 years.  Has significant issues with her teeth but is unable to afford extraction and dentures at this time.  Has reduced her smoking to less than half a pack per day.  Has been under increased stress lately so has difficulty trying to quit.  Gets regular physicals with her gynecologist.   Review of Systems  Constitutional:  Negative for activity change, appetite change and fatigue.  HENT:  Negative for sore throat and trouble swallowing.   Respiratory:  Negative for cough, chest tightness, shortness of breath and wheezing.   Cardiovascular:  Negative for chest pain.  Gastrointestinal:  Negative for abdominal distention, abdominal pain, constipation, diarrhea, nausea and vomiting.       Rare reflux which is relieved with occasional OTC Nexium.  Depends on what food she eats.  Genitourinary:  Negative for difficulty urinating, dysuria, enuresis, frequency, pelvic pain and urgency.      08/20/2023   10:31 AM  Depression screen PHQ 2/9  Decreased Interest 0  Down, Depressed, Hopeless 0  PHQ - 2 Score 0  Altered sleeping 0  Tired, decreased energy 0  Change in appetite 0  Feeling bad or failure about yourself  0  Trouble concentrating 0  Moving slowly or fidgety/restless 0  Suicidal thoughts 0  PHQ-9 Score 0   Difficult doing work/chores Not difficult at all      08/20/2023   10:31 AM 03/26/2022    9:55 AM  GAD 7 : Generalized Anxiety Score  Nervous, Anxious, on Edge 0 0  Control/stop worrying 0 0  Worry too much - different things 0 0  Trouble relaxing 0 0  Restless 0 0  Easily annoyed or irritable 0 2  Afraid - awful might happen 0 0  Total GAD 7 Score 0 2         Objective:   Physical Exam Constitutional:      General: She is not in acute distress.    Appearance: She is well-developed.  HENT:     Mouth/Throat:     Comments: Poor dentition with tooth loss noted. Neck:     Thyroid: No thyromegaly.     Trachea: No tracheal deviation.     Comments: Thyroid non tender to palpation. No mass or goiter noted.  Cardiovascular:     Rate and Rhythm: Normal rate and regular rhythm.     Heart sounds: Normal heart sounds. No murmur heard. Pulmonary:     Effort: Pulmonary effort is normal.     Breath sounds: Normal breath sounds.  Abdominal:     General: There is no distension.     Palpations: Abdomen is soft.     Tenderness: There is no abdominal tenderness.  Musculoskeletal:  Cervical back: Normal range of motion and neck supple.  Lymphadenopathy:     Cervical: No cervical adenopathy.     Upper Body:     Right upper body: No supraclavicular, axillary or pectoral adenopathy.     Left upper body: No supraclavicular, axillary or pectoral adenopathy.  Skin:    General: Skin is warm and dry.     Findings: No rash.  Neurological:     Mental Status: She is alert and oriented to person, place, and time.  Psychiatric:        Mood and Affect: Mood normal.        Behavior: Behavior normal.        Thought Content: Thought content normal.        Judgment: Judgment normal.    Today's Vitals   08/20/23 1024  BP: 108/66  Pulse: 82  Temp: 98 F (36.7 C)  SpO2: 99%  Weight: 193 lb 6.4 oz (87.7 kg)  Height: 5' 4.17" (1.63 m)   Body mass index is 33.02 kg/m.          Assessment & Plan:   Problem List Items Addressed This Visit       Digestive   Poor dentition     Other   Annual physical exam - Primary   Relevant Orders   CBC with Differential/Platelet   Comprehensive metabolic panel   Lipid panel   TSH   Hepatitis C antibody   Hyperlipidemia   Relevant Orders   Lipid panel   Other Visit Diagnoses     Screening for deficiency anemia       Relevant Orders   CBC with Differential/Platelet   Screening for metabolic disorder       Relevant Orders   Comprehensive metabolic panel   Screening for thyroid disorder       Relevant Orders   TSH   Encounter for hepatitis C screening test for low risk patient       Relevant Orders   Hepatitis C antibody   Encounter for screening for malignant neoplasm of breast, unspecified screening modality       Screening mammogram for breast cancer       Relevant Orders   MM Digital Screening      Yearly labs ordered. Hold on Cologuard or colon cancer screening per patient until next year. Mammogram ordered. Encourage patient to look at getting gradual extraction of her teeth to make this more affordable.  Discussed concerns regarding inflammation and infection in her mouth. Encourage patient to continue decreasing or stopping her smoking. Discussed importance of healthy diet. Return in about 1 year (around 08/19/2024) for physical.

## 2023-08-21 ENCOUNTER — Encounter: Payer: Self-pay | Admitting: Nurse Practitioner

## 2023-08-27 ENCOUNTER — Ambulatory Visit (HOSPITAL_COMMUNITY)
Admission: RE | Admit: 2023-08-27 | Discharge: 2023-08-27 | Disposition: A | Discharge status: Home / Self Care | Payer: BC Managed Care – PPO | Source: Ambulatory Visit | Attending: Nurse Practitioner | Admitting: Nurse Practitioner

## 2023-08-27 DIAGNOSIS — Z1231 Encounter for screening mammogram for malignant neoplasm of breast: Secondary | ICD-10-CM | POA: Insufficient documentation

## 2023-09-04 DIAGNOSIS — Z0000 Encounter for general adult medical examination without abnormal findings: Secondary | ICD-10-CM | POA: Diagnosis not present

## 2023-09-04 DIAGNOSIS — Z1159 Encounter for screening for other viral diseases: Secondary | ICD-10-CM | POA: Diagnosis not present

## 2023-09-04 DIAGNOSIS — Z13228 Encounter for screening for other metabolic disorders: Secondary | ICD-10-CM | POA: Diagnosis not present

## 2023-09-04 DIAGNOSIS — E785 Hyperlipidemia, unspecified: Secondary | ICD-10-CM | POA: Diagnosis not present

## 2023-09-04 DIAGNOSIS — Z130 Encounter for screening for diseases of the blood and blood-forming organs and certain disorders involving the immune mechanism: Secondary | ICD-10-CM | POA: Diagnosis not present

## 2023-09-05 LAB — COMPREHENSIVE METABOLIC PANEL
ALT: 13 [IU]/L (ref 0–32)
AST: 13 [IU]/L (ref 0–40)
Albumin: 4.1 g/dL (ref 3.9–4.9)
Alkaline Phosphatase: 120 [IU]/L (ref 44–121)
BUN/Creatinine Ratio: 22 (ref 9–23)
BUN: 17 mg/dL (ref 6–24)
Bilirubin Total: 0.2 mg/dL (ref 0.0–1.2)
CO2: 23 mmol/L (ref 20–29)
Calcium: 9.9 mg/dL (ref 8.7–10.2)
Chloride: 104 mmol/L (ref 96–106)
Creatinine, Ser: 0.78 mg/dL (ref 0.57–1.00)
Globulin, Total: 2.4 g/dL (ref 1.5–4.5)
Glucose: 93 mg/dL (ref 70–99)
Potassium: 4.1 mmol/L (ref 3.5–5.2)
Sodium: 140 mmol/L (ref 134–144)
Total Protein: 6.5 g/dL (ref 6.0–8.5)
eGFR: 93 mL/min/{1.73_m2} (ref >59)

## 2023-09-05 LAB — CBC WITH DIFFERENTIAL/PLATELET
Basophils Absolute: 0.1 10*3/uL (ref 0.0–0.2)
Basos: 1 %
EOS (ABSOLUTE): 0.2 10*3/uL (ref 0.0–0.4)
Eos: 2 %
Hematocrit: 43.3 % (ref 34.0–46.6)
Hemoglobin: 14.1 g/dL (ref 11.1–15.9)
Immature Grans (Abs): 0 10*3/uL (ref 0.0–0.1)
Immature Granulocytes: 0 %
Lymphocytes Absolute: 2.8 10*3/uL (ref 0.7–3.1)
Lymphs: 31 %
MCH: 28.7 pg (ref 26.6–33.0)
MCHC: 32.6 g/dL (ref 31.5–35.7)
MCV: 88 fL (ref 79–97)
Monocytes Absolute: 0.5 10*3/uL (ref 0.1–0.9)
Monocytes: 5 %
Neutrophils Absolute: 5.4 10*3/uL (ref 1.4–7.0)
Neutrophils: 61 %
Platelets: 260 10*3/uL (ref 150–450)
RBC: 4.91 x10E6/uL (ref 3.77–5.28)
RDW: 13.2 % (ref 11.7–15.4)
WBC: 8.9 10*3/uL (ref 3.4–10.8)

## 2023-09-05 LAB — LIPID PANEL
Chol/HDL Ratio: 4.5 {ratio} — ABNORMAL HIGH (ref 0.0–4.4)
Cholesterol, Total: 226 mg/dL — ABNORMAL HIGH (ref 100–199)
HDL: 50 mg/dL (ref >39)
LDL Chol Calc (NIH): 154 mg/dL — ABNORMAL HIGH (ref 0–99)
Triglycerides: 124 mg/dL (ref 0–149)
VLDL Cholesterol Cal: 22 mg/dL (ref 5–40)

## 2023-09-05 LAB — TSH: TSH: 2.94 u[IU]/mL (ref 0.450–4.500)

## 2023-09-05 LAB — HEPATITIS C ANTIBODY: Hep C Virus Ab: NONREACTIVE

## 2023-09-22 ENCOUNTER — Ambulatory Visit
Admission: EM | Admit: 2023-09-22 | Discharge: 2023-09-22 | Disposition: A | Discharge status: Home / Self Care | Payer: BC Managed Care – PPO | Attending: Nurse Practitioner | Admitting: Nurse Practitioner

## 2023-09-22 ENCOUNTER — Encounter: Payer: Self-pay | Admitting: Emergency Medicine

## 2023-09-22 DIAGNOSIS — J069 Acute upper respiratory infection, unspecified: Secondary | ICD-10-CM | POA: Diagnosis not present

## 2023-09-22 MED ORDER — PSEUDOEPH-BROMPHEN-DM 30-2-10 MG/5ML PO SYRP: 5.0000 mL | ORAL_SOLUTION | Freq: Four times a day (QID) | ORAL | 0 refills | Status: DC | PRN

## 2023-09-22 MED ORDER — CETIRIZINE HCL 10 MG PO TABS: 10.0000 mg | ORAL_TABLET | Freq: Every day | ORAL | 0 refills | Status: DC

## 2023-09-22 NOTE — Discharge Instructions (Signed)
Take medication as prescribed. As discussed, you have elected to perform a home COVID test.  If your test is positive, and you would like to receive treatment, you may follow-up in this clinic or with your primary care physician. Increase fluids and allow for plenty of rest. May take over-the-counter Tylenol or ibuprofen as needed for pain, fever, general discomfort. Recommend normal saline nasal spray throughout the day for nasal congestion and runny nose. For the cough, it will be helpful for you to sleep slightly elevated and to use a humidifier in your bedroom during sleep. While your symptoms persist, try to cut back on smoking as this will prolong your recovery. If symptoms have not improved over the next 10 to 14 days, or if symptoms suddenly worsen, you may follow-up in this clinic or with your primary care physician for further evaluation. Follow-up as needed.

## 2023-09-22 NOTE — ED Provider Notes (Signed)
[facility]    CSN: [id number] Arrival date & time: 09/22/23  0813      History   Chief Complaint Chief Complaint  Patient presents with   Cough    HPI Breanna Mcguire is a 47 y.o. female.   The history is provided by the patient.   Patient presents for complaints of cough and headache that started over the past 24 hours.  Patient denies fever, chills, sore throat, ear pain, wheezing, difficulty breathing, chest pain, abdominal pain, nausea, vomiting, diarrhea, or rash.  Patient reports that she has taken ibuprofen for her headache.  She states that her daughter was sick with the same or similar symptoms.  Patient reports that she currently does smoke.  Past Medical History:  Diagnosis Date   Plaque psoriasis     Patient Active Problem List   Diagnosis Date Noted   Poor dentition 07/30/2022   Annual physical exam 07/30/2022   Hyperlipidemia 07/26/2021   Vasomotor symptoms due to menopause 02/17/2019   Dyshidrotic eczema 03/16/2017   Gastroesophageal reflux disease without esophagitis 12/10/2015   Irritable bowel syndrome with both constipation and diarrhea 12/10/2015    Past Surgical History:  Procedure Laterality Date   LAPAROSCOPIC ABDOMINAL EXPLORATION     TUBAL LIGATION      OB History     Gravida  2   Para  2   Term  2   Preterm      AB      Living  2      SAB      IAB      Ectopic      Multiple      Live Births  2            Home Medications    Prior to Admission medications   Medication Sig Start Date End Date Taking? Authorizing Provider  brompheniramine-pseudoephedrine-DM 30-2-10 MG/5ML syrup Take 5 mLs by mouth 4 (four) times daily as needed. 09/22/23  Yes Leath-Warren, Sadie Haber, NP  cetirizine (ZYRTEC) 10 MG tablet Take 1 tablet (10 mg total) by mouth daily. 09/22/23  Yes Leath-Warren, Sadie Haber, NP    Family History Family History  Problem Relation Age of Onset   Hearing loss Mother    Hypertension  Mother    Asthma Son    Cancer Maternal Grandmother        liver   Diabetes Paternal Grandmother     Social History Social History   Tobacco Use   Smoking status: Every Day    Current packs/day: 0.50    Average packs/day: 0.5 packs/day for 25.0 years (12.5 ttl pk-yrs)    Types: Cigarettes   Smokeless tobacco: Never  Vaping Use   Vaping status: Never Used  Substance Use Topics   Alcohol use: Yes    Alcohol/week: 0.0 standard drinks of alcohol    Comment: occ.   Drug use: No     Allergies   Codeine, Sulfa antibiotics, and Zithromax [azithromycin]   Review of Systems Review of Systems Per HPI  Physical Exam Triage Vital Signs ED Triage Vitals  Encounter Vitals Group     BP 09/22/23 0839 135/79     Systolic BP Percentile --      Diastolic BP Percentile --      Pulse Rate 09/22/23 0839 78     Resp 09/22/23 0839 18     Temp 09/22/23 0839 97.8 F (36.6 C)     Temp Source 09/22/23 0839 Oral  SpO2 09/22/23 0839 97 %     Weight --      Height --      Head Circumference --      Peak Flow --      Pain Score 09/22/23 0840 2     Pain Loc --      Pain Education --      Exclude from Growth Chart --    No data found.  Updated Vital Signs BP 135/79 (BP Location: Right Arm)   Pulse 78   Temp 97.8 F (36.6 C) (Oral)   Resp 18   LMP 03/01/2022 (Exact Date)   SpO2 97%   Visual Acuity Right Eye Distance:   Left Eye Distance:   Bilateral Distance:    Right Eye Near:   Left Eye Near:    Bilateral Near:     Physical Exam Vitals and nursing note reviewed.  Constitutional:      General: She is not in acute distress.    Appearance: Normal appearance.  HENT:     Head: Normocephalic.     Right Ear: Tympanic membrane, ear canal and external ear normal.     Left Ear: Tympanic membrane, ear canal and external ear normal.     Nose: Congestion present.     Right Turbinates: Enlarged and swollen.     Left Turbinates: Enlarged and swollen.     Right Sinus: No  maxillary sinus tenderness or frontal sinus tenderness.     Left Sinus: No maxillary sinus tenderness or frontal sinus tenderness.     Mouth/Throat:     Lips: Pink.     Mouth: Mucous membranes are moist.     Pharynx: Posterior oropharyngeal erythema and postnasal drip present. No pharyngeal swelling, oropharyngeal exudate or uvula swelling.     Comments: Cobblestoning present to posterior oropharynx  Eyes:     Extraocular Movements: Extraocular movements intact.     Conjunctiva/sclera: Conjunctivae normal.     Pupils: Pupils are equal, round, and reactive to light.  Cardiovascular:     Rate and Rhythm: Normal rate and regular rhythm.     Pulses: Normal pulses.     Heart sounds: Normal heart sounds.  Pulmonary:     Effort: Pulmonary effort is normal. No respiratory distress.     Breath sounds: Normal breath sounds. No stridor. No wheezing, rhonchi or rales.  Abdominal:     General: Bowel sounds are normal.     Palpations: Abdomen is soft.     Tenderness: There is no abdominal tenderness.  Musculoskeletal:     Cervical back: Normal range of motion.  Lymphadenopathy:     Cervical: No cervical adenopathy.  Skin:    General: Skin is warm and dry.  Neurological:     General: No focal deficit present.     Mental Status: She is alert and oriented to person, place, and time.  Psychiatric:        Mood and Affect: Mood normal.        Behavior: Behavior normal.      UC Treatments / Results  Labs (all labs ordered are listed, but only abnormal results are displayed) Labs Reviewed - No data to display  EKG   Radiology No results found.  Procedures Procedures (including critical care time)  Medications Ordered in UC Medications - No data to display  Initial Impression / Assessment and Plan / UC Course  I have reviewed the triage vital signs and the nursing notes.  Pertinent labs & imaging  results that were available during my care of the patient were reviewed by me and  considered in my medical decision making (see chart for details).  On exam, patient's room air sats at 97%, symptoms started over the past 24 hours.  At this stage, suspect symptoms are consistent with a viral upper respiratory infection with cough.  Will provide symptomatic treatment with Bromfed-DM, and to help with postnasal drip, cetirizine 10 mg as prescribed.  Supportive care recommendations were provided and discussed with the patient to include fluids, rest, over-the-counter analgesics, normal saline nasal spray, and use of a humidifier.  Also advised patient to try to cut back on smoking while symptoms persist.  Patient declined COVID testing, states that she will perform home test.  Discussed indications with the patient open follow-up be necessary.  Patient is in agreement with this plan of care and verbalizes understanding.  All questions were answered.  Patient stable for discharge.   Final Clinical Impressions(s) / UC Diagnoses   Final diagnoses:  Viral upper respiratory tract infection with cough     Discharge Instructions      Take medication as prescribed. As discussed, you have elected to perform a home COVID test.  If your test is positive, and you would like to receive treatment, you may follow-up in this clinic or with your primary care physician. Increase fluids and allow for plenty of rest. May take over-the-counter Tylenol or ibuprofen as needed for pain, fever, general discomfort. Recommend normal saline nasal spray throughout the day for nasal congestion and runny nose. For the cough, it will be helpful for you to sleep slightly elevated and to use a humidifier in your bedroom during sleep. While your symptoms persist, try to cut back on smoking as this will prolong your recovery. If symptoms have not improved over the next 10 to 14 days, or if symptoms suddenly worsen, you may follow-up in this clinic or with your primary care physician for further  evaluation. Follow-up as needed.     ED Prescriptions     Medication Sig Dispense Auth. Provider   brompheniramine-pseudoephedrine-DM 30-2-10 MG/5ML syrup Take 5 mLs by mouth 4 (four) times daily as needed. 140 mL Leath-Warren, Sadie Haber, NP   cetirizine (ZYRTEC) 10 MG tablet Take 1 tablet (10 mg total) by mouth daily. 30 tablet Leath-Warren, Sadie Haber, NP      PDMP not reviewed this encounter.   Abran Cantor, NP 09/22/23 [phone]

## 2023-09-22 NOTE — ED Triage Notes (Signed)
Cough and headache that started yesterday.

## 2024-02-29 ENCOUNTER — Ambulatory Visit: Admitting: Nurse Practitioner

## 2024-02-29 ENCOUNTER — Encounter: Payer: Self-pay | Admitting: Nurse Practitioner

## 2024-02-29 VITALS — BP 121/72 | HR 97 | Temp 97.7°F | Ht 64.17 in | Wt 194.0 lb

## 2024-02-29 DIAGNOSIS — M25561 Pain in right knee: Secondary | ICD-10-CM | POA: Diagnosis not present

## 2024-02-29 MED ORDER — MELOXICAM 15 MG PO TABS: 15.0000 mg | ORAL_TABLET | Freq: Every day | ORAL | 0 refills | Status: AC

## 2024-03-01 ENCOUNTER — Encounter: Payer: Self-pay | Admitting: Nurse Practitioner

## 2024-03-01 NOTE — Progress Notes (Signed)
   Subjective:    Patient ID: Breanna Mcguire, female    DOB: 1976-07-31, 48 y.o.   MRN: [id number]  HPI Presents with a of right knee swelling and pain that began back in November.  Has noticed slight swelling particularly in the ankle area on the right side as well over the past couple weeks.  No redness warmth or nodularity in the leg.  Has a remote history of a significant right knee injury when she was 48 years old.  Has been doing some lifting and pulling lately.  Has tried wrapping the knee and has worn a knee brace that her family has at home with minimal improvement.  Has started to affect her gait slightly and causing mild tenderness in the left knee as well.  Slight relief of discomfort with ibuprofen.        Objective:   Physical Exam NAD.  Alert, oriented.  Right knee mild edema, no erythema or warmth.  No crepitus.  Minimal tenderness with passive ROM.  Mild joint laxity in the right knee as compared to the left.  No edema of the lower leg.  Calf circumference left side 42.5 cm right side 42 cm.  Mild nonpitting edema noted in both ankles more on the right.  Strong DP pulses with normal capillary refill right foot. Today's Vitals   02/29/24 1526  BP: 121/72  Pulse: 97  Temp: 97.7 F (36.5 C)  SpO2: 98%  Weight: 194 lb (88 kg)  Height: 5' 4.17" (1.63 m)   Body mass index is 33.12 kg/m.        Assessment & Plan:  Acute pain of right knee - Plan: Ambulatory referral to Orthopedic Surgery Patient states she takes daily OTC Nexium for reflux which is under good control. Meds ordered this encounter  Medications   meloxicam (MOBIC) 15 MG tablet    Sig: Take 1 tablet (15 mg total) by mouth daily. With food prn pain    Dispense:  30 tablet    Refill:  0    Supervising Provider:   Charlotta Cook A [[id number]]   Take meloxicam once daily with food as needed pain.  Discontinue if any acid reflux or stomach upset.  Recommend Ace wrap to help stabilize the knee.  Referred to  orthopedic specialist for evaluation. Recheck here as needed.

## 2024-04-05 DIAGNOSIS — M25561 Pain in right knee: Secondary | ICD-10-CM | POA: Diagnosis not present

## 2024-04-22 DIAGNOSIS — M25561 Pain in right knee: Secondary | ICD-10-CM | POA: Diagnosis not present

## 2024-04-27 DIAGNOSIS — M25561 Pain in right knee: Secondary | ICD-10-CM | POA: Diagnosis not present

## 2024-04-29 DIAGNOSIS — M25561 Pain in right knee: Secondary | ICD-10-CM | POA: Diagnosis not present

## 2024-05-06 DIAGNOSIS — M25561 Pain in right knee: Secondary | ICD-10-CM | POA: Diagnosis not present

## 2024-05-10 DIAGNOSIS — M25561 Pain in right knee: Secondary | ICD-10-CM | POA: Diagnosis not present

## 2024-07-21 ENCOUNTER — Ambulatory Visit
Admission: EM | Admit: 2024-07-21 | Discharge: 2024-07-21 | Disposition: A | Discharge status: Home / Self Care | Attending: Nurse Practitioner | Admitting: Nurse Practitioner

## 2024-07-21 DIAGNOSIS — M5441 Lumbago with sciatica, right side: Secondary | ICD-10-CM | POA: Diagnosis not present

## 2024-07-21 DIAGNOSIS — Z8739 Personal history of other diseases of the musculoskeletal system and connective tissue: Secondary | ICD-10-CM | POA: Diagnosis not present

## 2024-07-21 MED ORDER — TIZANIDINE HCL 4 MG PO TABS: 4.0000 mg | ORAL_TABLET | Freq: Three times a day (TID) | ORAL | 0 refills | Status: AC | PRN

## 2024-07-21 MED ORDER — KETOROLAC TROMETHAMINE 30 MG/ML IJ SOLN
30.0000 mg | Freq: Once | INTRAMUSCULAR | Status: AC
  Administered 2024-07-21: 30 mg via INTRAMUSCULAR

## 2024-07-21 MED ORDER — IBUPROFEN 800 MG PO TABS
800.0000 mg | ORAL_TABLET | Freq: Three times a day (TID) | ORAL | 0 refills | Status: AC
Start: 2024-07-21 — End: ?

## 2024-07-21 NOTE — Discharge Instructions (Addendum)
 You were given an injection of Toradol  30 mg.  Do not take any additional NSAIDs today to include ibuprofen , Aleve, Motrin , Advil , or naproxen.  You may take Tylenol for breakthrough pain. Take medication as prescribed.  Do not take the meloxicam  while you are taking ibuprofen . Recommend the use of ice or heat.  Apply ice for pain or swelling, heat for spasm or stiffness.  Apply for 20 minutes, remove for 1 hour, repeat as needed. Gentle stretching exercises while symptoms persist.  I provided exercises for you to begin performing at least 2-3 times daily. Go to the emergency department immediately if you experience sudden loss of bowel or bladder function, become unable to ambulate, or with worsening pain. Follow-up with your primary care physician or with orthopedics if symptoms fail to improve. Follow-up as needed.

## 2024-07-21 NOTE — ED Triage Notes (Signed)
 Pt reports lower back pain right side into the right hip. Started yesterday. Pt has tried motrin  and heating pad, helped easy the pain but did not alleviate the pain fully. Pain is present fro lying to sitting and sitting to standing, occasionally while walking around.

## 2024-07-21 NOTE — ED Provider Notes (Signed)
 [facility]    CSN: [id number] Arrival date & time: 07/21/24  0940      History   Chief Complaint No chief complaint on file.   HPI Breanna Mcguire is a 48 y.o. female.   The history is provided by the patient.   Patient presents for complaints of right sided low back pain.  Patient states symptoms started 1 day ago after she was trying to get off the couch with her 49-month-old nephew and her hand.  Patient states that since that time, she has had pain in the right side of her low back and in her right buttock.  Pain worsens when patient goes from a sitting to a standing position.  She denies numbness, tingling, lower extremity weakness, loss of bowel or bladder function, or urinary symptoms.  Patient states she has not had a problem with her back for at least 20 years.  Patient states so far she has been taking Motrin  and using a heating pad.  Past Medical History:  Diagnosis Date   Plaque psoriasis     Patient Active Problem List   Diagnosis Date Noted   Poor dentition 07/30/2022   Annual physical exam 07/30/2022   Hyperlipidemia 07/26/2021   Vasomotor symptoms due to menopause 02/17/2019   Dyshidrotic eczema 03/16/2017   Gastroesophageal reflux disease without esophagitis 12/10/2015   Irritable bowel syndrome with both constipation and diarrhea 12/10/2015    Past Surgical History:  Procedure Laterality Date   LAPAROSCOPIC ABDOMINAL EXPLORATION     TUBAL LIGATION      OB History     Gravida  2   Para  2   Term  2   Preterm      AB      Living  2      SAB      IAB      Ectopic      Multiple      Live Births  2            Home Medications    Prior to Admission medications   Medication Sig Start Date End Date Taking? Authorizing Provider  ibuprofen  (ADVIL ) 800 MG tablet Take 1 tablet (800 mg total) by mouth 3 (three) times daily. 07/21/24  Yes Leath-Warren, Etta PARAS, NP  tiZANidine  (ZANAFLEX ) 4 MG tablet Take 1 tablet (4 mg  total) by mouth every 8 (eight) hours as needed for muscle spasms. 07/21/24  Yes Leath-Warren, Etta PARAS, NP  meloxicam  (MOBIC ) 15 MG tablet Take 1 tablet (15 mg total) by mouth daily. With food prn pain 02/29/24   Mauro Elveria BROCKS, NP    Family History Family History  Problem Relation Age of Onset   Hearing loss Mother    Hypertension Mother    Asthma Son    Cancer Maternal Grandmother        liver   Diabetes Paternal Grandmother     Social History Social History   Tobacco Use   Smoking status: Every Day    Current packs/day: 0.50    Average packs/day: 0.5 packs/day for 25.0 years (12.5 ttl pk-yrs)    Types: Cigarettes   Smokeless tobacco: Never  Vaping Use   Vaping status: Never Used  Substance Use Topics   Alcohol use: Yes    Alcohol/week: 0.0 standard drinks of alcohol    Comment: occ.   Drug use: No     Allergies   Codeine, Other, Sulfa antibiotics, and Zithromax [azithromycin]   Review of  Systems Review of Systems Per HPI  Physical Exam Triage Vital Signs ED Triage Vitals  Encounter Vitals Group     BP 07/21/24 1049 (!) 118/57     Girls Systolic BP Percentile --      Girls Diastolic BP Percentile --      Boys Systolic BP Percentile --      Boys Diastolic BP Percentile --      Pulse Rate 07/21/24 1049 68     Resp 07/21/24 1049 18     Temp --      Temp src --      SpO2 07/21/24 1049 97 %     Weight --      Height --      Head Circumference --      Peak Flow --      Pain Score 07/21/24 1053 9     Pain Loc --      Pain Education --      Exclude from Growth Chart --    No data found.  Updated Vital Signs BP (!) 118/57 (BP Location: Right Arm)   Pulse 68   Resp 18   LMP 03/01/2022 (Exact Date)   SpO2 97%   Visual Acuity Right Eye Distance:   Left Eye Distance:   Bilateral Distance:    Right Eye Near:   Left Eye Near:    Bilateral Near:     Physical Exam Vitals and nursing note reviewed.  Constitutional:      General: She is not in  acute distress.    Appearance: Normal appearance.  HENT:     Head: Normocephalic.  Eyes:     Extraocular Movements: Extraocular movements intact.     Conjunctiva/sclera: Conjunctivae normal.     Pupils: Pupils are equal, round, and reactive to light.  Cardiovascular:     Rate and Rhythm: Normal rate and regular rhythm.     Pulses: Normal pulses.     Heart sounds: Normal heart sounds.  Pulmonary:     Effort: Pulmonary effort is normal. No respiratory distress.     Breath sounds: Normal breath sounds. No stridor. No wheezing, rhonchi or rales.  Abdominal:     General: Bowel sounds are normal.     Palpations: Abdomen is soft.     Tenderness: There is no abdominal tenderness.  Musculoskeletal:     Cervical back: Normal range of motion.       Back:  Skin:    General: Skin is warm and dry.  Neurological:     General: No focal deficit present.     Mental Status: She is alert and oriented to person, place, and time.  Psychiatric:        Mood and Affect: Mood normal.        Behavior: Behavior normal.      UC Treatments / Results  Labs (all labs ordered are listed, but only abnormal results are displayed) Labs Reviewed - No data to display  EKG   Radiology No results found.  Procedures Procedures (including critical care time)  Medications Ordered in UC Medications  ketorolac  (TORADOL ) 30 MG/ML injection 30 mg (has no administration in time range)    Initial Impression / Assessment and Plan / UC Course  I have reviewed the triage vital signs and the nursing notes.  Pertinent labs & imaging results that were available during my care of the patient were reviewed by me and considered in my medical decision making (see chart for details).  Toradol  30 mg IM administered for pain.  Will start patient on ibuprofen  800 mg and tizanidine  4 mg as a muscle relaxer.  Patient advised to refrain from taking meloxicam  while she is taking the ibuprofen .  Supportive care  recommendations were provided and discussed with the patient to include the use of ice or heat, and stretching exercises.  Patient was also given strict ER follow-up precautions.  Patient was in agreement with this plan of care and verbalizes understanding.  All questions were answered.  Patient stable for discharge.   Final Clinical Impressions(s) / UC Diagnoses   Final diagnoses:  Right-sided low back pain with right-sided sciatica, unspecified chronicity  History of back pain     Discharge Instructions      You were given an injection of Toradol  30 mg.  Do not take any additional NSAIDs today to include ibuprofen , Aleve, Motrin , Advil , or naproxen.  You may take Tylenol for breakthrough pain. Take medication as prescribed.  Do not take the meloxicam  while you are taking ibuprofen . Recommend the use of ice or heat.  Apply ice for pain or swelling, heat for spasm or stiffness.  Apply for 20 minutes, remove for 1 hour, repeat as needed. Gentle stretching exercises while symptoms persist.  I provided exercises for you to begin performing at least 2-3 times daily. Go to the emergency department immediately if you experience sudden loss of bowel or bladder function, become unable to ambulate, or with worsening pain. Follow-up with your primary care physician or with orthopedics if symptoms fail to improve. Follow-up as needed.     ED Prescriptions     Medication Sig Dispense Auth. Provider   ibuprofen  (ADVIL ) 800 MG tablet Take 1 tablet (800 mg total) by mouth 3 (three) times daily. 21 tablet Leath-Warren, Etta PARAS, NP   tiZANidine  (ZANAFLEX ) 4 MG tablet Take 1 tablet (4 mg total) by mouth every 8 (eight) hours as needed for muscle spasms. 20 tablet Leath-Warren, Etta PARAS, NP      PDMP not reviewed this encounter.   Gilmer Etta PARAS, NP 07/21/24 1110
